# Patient Record
Sex: Female | Born: 1935 | Race: White | Hispanic: No | Smoking: Never smoker
Health system: Southern US, Community
[De-identification: ages and names within clinical notes are randomized; demographics above are authoritative.]

## PROBLEM LIST (undated history)

## (undated) DIAGNOSIS — E7800 Pure hypercholesterolemia, unspecified: Secondary | ICD-10-CM

## (undated) DIAGNOSIS — N289 Disorder of kidney and ureter, unspecified: Secondary | ICD-10-CM

## (undated) DIAGNOSIS — I350 Nonrheumatic aortic (valve) stenosis: Secondary | ICD-10-CM

## (undated) DIAGNOSIS — I10 Essential (primary) hypertension: Secondary | ICD-10-CM

## (undated) DIAGNOSIS — I2510 Atherosclerotic heart disease of native coronary artery without angina pectoris: Secondary | ICD-10-CM

## (undated) DIAGNOSIS — E669 Obesity, unspecified: Secondary | ICD-10-CM

## (undated) HISTORY — DX: Obesity, unspecified: E66.9

## (undated) HISTORY — PX: TOTAL ABDOMINAL HYSTERECTOMY: SHX209

## (undated) HISTORY — PX: CORONARY ARTERY BYPASS GRAFT: SHX141

## (undated) HISTORY — DX: Nonrheumatic aortic (valve) stenosis: I35.0

## (undated) HISTORY — DX: Essential (primary) hypertension: I10

## (undated) HISTORY — DX: Disorder of kidney and ureter, unspecified: N28.9

## (undated) HISTORY — PX: CHOLECYSTECTOMY: SHX55

## (undated) HISTORY — DX: Pure hypercholesterolemia, unspecified: E78.00

## (undated) HISTORY — DX: Atherosclerotic heart disease of native coronary artery without angina pectoris: I25.10

---

## 1997-07-25 ENCOUNTER — Encounter: Admission: RE | Admit: 1997-07-25 | Discharge: 1997-10-23 | Payer: Self-pay | Admitting: *Deleted

## 1997-07-30 ENCOUNTER — Encounter: Admission: RE | Admit: 1997-07-30 | Discharge: 1997-07-30 | Payer: Self-pay | Admitting: Hematology and Oncology

## 1997-08-29 ENCOUNTER — Encounter: Admission: RE | Admit: 1997-08-29 | Discharge: 1997-08-29 | Payer: Self-pay | Admitting: Hematology and Oncology

## 1997-09-27 ENCOUNTER — Encounter: Admission: RE | Admit: 1997-09-27 | Discharge: 1997-09-27 | Payer: Self-pay | Admitting: Internal Medicine

## 1997-11-01 ENCOUNTER — Ambulatory Visit (HOSPITAL_COMMUNITY): Admission: RE | Admit: 1997-11-01 | Discharge: 1997-11-01 | Payer: Self-pay | Admitting: Gastroenterology

## 1997-12-10 ENCOUNTER — Encounter: Admission: RE | Admit: 1997-12-10 | Discharge: 1997-12-10 | Payer: Self-pay | Admitting: Internal Medicine

## 1998-02-12 ENCOUNTER — Ambulatory Visit (HOSPITAL_COMMUNITY): Admission: RE | Admit: 1998-02-12 | Discharge: 1998-02-12 | Payer: Self-pay | Admitting: Internal Medicine

## 1998-02-12 ENCOUNTER — Encounter: Admission: RE | Admit: 1998-02-12 | Discharge: 1998-02-12 | Payer: Self-pay | Admitting: Internal Medicine

## 1998-02-13 ENCOUNTER — Encounter: Payer: Self-pay | Admitting: Radiology

## 1998-02-13 ENCOUNTER — Ambulatory Visit: Admission: RE | Admit: 1998-02-13 | Discharge: 1998-02-13 | Payer: Self-pay | Admitting: Radiology

## 1998-02-27 ENCOUNTER — Encounter: Admission: RE | Admit: 1998-02-27 | Discharge: 1998-05-17 | Payer: Self-pay | Admitting: Internal Medicine

## 1998-03-28 ENCOUNTER — Encounter: Admission: RE | Admit: 1998-03-28 | Discharge: 1998-03-28 | Payer: Self-pay | Admitting: Internal Medicine

## 1998-06-05 ENCOUNTER — Encounter: Admission: RE | Admit: 1998-06-05 | Discharge: 1998-06-05 | Payer: Self-pay | Admitting: Internal Medicine

## 1998-06-06 ENCOUNTER — Emergency Department (HOSPITAL_COMMUNITY): Admission: EM | Admit: 1998-06-06 | Discharge: 1998-06-06 | Payer: Self-pay | Admitting: Emergency Medicine

## 1998-06-27 ENCOUNTER — Encounter: Admission: RE | Admit: 1998-06-27 | Discharge: 1998-06-27 | Payer: Self-pay | Admitting: Internal Medicine

## 1998-07-09 ENCOUNTER — Encounter: Admission: RE | Admit: 1998-07-09 | Discharge: 1998-07-09 | Payer: Self-pay | Admitting: Internal Medicine

## 1998-09-02 ENCOUNTER — Encounter: Admission: RE | Admit: 1998-09-02 | Discharge: 1998-11-25 | Payer: Self-pay | Admitting: Orthopedic Surgery

## 1998-09-06 ENCOUNTER — Encounter: Admission: RE | Admit: 1998-09-06 | Discharge: 1998-09-06 | Payer: Self-pay | Admitting: Internal Medicine

## 1998-12-06 ENCOUNTER — Encounter: Admission: RE | Admit: 1998-12-06 | Discharge: 1998-12-06 | Payer: Self-pay | Admitting: Internal Medicine

## 1999-02-17 ENCOUNTER — Ambulatory Visit (HOSPITAL_COMMUNITY): Admission: RE | Admit: 1999-02-17 | Discharge: 1999-02-17 | Payer: Self-pay

## 1999-03-04 ENCOUNTER — Encounter: Admission: RE | Admit: 1999-03-04 | Discharge: 1999-06-02 | Payer: Self-pay | Admitting: Orthopedic Surgery

## 1999-03-07 ENCOUNTER — Encounter: Admission: RE | Admit: 1999-03-07 | Discharge: 1999-03-07 | Payer: Self-pay | Admitting: Internal Medicine

## 1999-05-02 ENCOUNTER — Encounter: Admission: RE | Admit: 1999-05-02 | Discharge: 1999-05-02 | Payer: Self-pay | Admitting: Internal Medicine

## 1999-06-10 ENCOUNTER — Encounter: Admission: RE | Admit: 1999-06-10 | Discharge: 1999-09-08 | Payer: Self-pay | Admitting: Orthopedic Surgery

## 1999-07-04 ENCOUNTER — Encounter: Admission: RE | Admit: 1999-07-04 | Discharge: 1999-07-04 | Payer: Self-pay | Admitting: Internal Medicine

## 1999-08-06 ENCOUNTER — Encounter (INDEPENDENT_AMBULATORY_CARE_PROVIDER_SITE_OTHER): Payer: Self-pay | Admitting: *Deleted

## 1999-08-06 ENCOUNTER — Ambulatory Visit (HOSPITAL_COMMUNITY): Admission: RE | Admit: 1999-08-06 | Discharge: 1999-08-06 | Payer: Self-pay | Admitting: Gastroenterology

## 1999-08-29 ENCOUNTER — Encounter: Admission: RE | Admit: 1999-08-29 | Discharge: 1999-08-29 | Payer: Self-pay | Admitting: Internal Medicine

## 1999-09-15 ENCOUNTER — Encounter: Admission: RE | Admit: 1999-09-15 | Discharge: 1999-12-14 | Payer: Self-pay | Admitting: Internal Medicine

## 1999-11-14 ENCOUNTER — Encounter: Admission: RE | Admit: 1999-11-14 | Discharge: 1999-11-14 | Payer: Self-pay | Admitting: Internal Medicine

## 1999-11-14 ENCOUNTER — Inpatient Hospital Stay (HOSPITAL_COMMUNITY): Admission: EM | Admit: 1999-11-14 | Discharge: 1999-11-16 | Payer: Self-pay | Admitting: Emergency Medicine

## 1999-11-14 ENCOUNTER — Encounter: Payer: Self-pay | Admitting: Internal Medicine

## 1999-11-16 ENCOUNTER — Encounter: Payer: Self-pay | Admitting: Internal Medicine

## 1999-11-25 ENCOUNTER — Encounter: Admission: RE | Admit: 1999-11-25 | Discharge: 1999-11-25 | Payer: Self-pay | Admitting: Internal Medicine

## 1999-12-08 ENCOUNTER — Ambulatory Visit: Admission: RE | Admit: 1999-12-08 | Discharge: 1999-12-08 | Payer: Self-pay

## 2000-01-01 ENCOUNTER — Encounter: Admission: RE | Admit: 2000-01-01 | Discharge: 2000-01-01 | Payer: Self-pay | Admitting: Hematology and Oncology

## 2000-01-01 ENCOUNTER — Ambulatory Visit (HOSPITAL_COMMUNITY): Admission: RE | Admit: 2000-01-01 | Discharge: 2000-01-01 | Payer: Self-pay | Admitting: Hematology and Oncology

## 2000-03-03 ENCOUNTER — Encounter: Admission: RE | Admit: 2000-03-03 | Discharge: 2000-03-03 | Payer: Self-pay | Admitting: Internal Medicine

## 2000-06-23 ENCOUNTER — Encounter: Admission: RE | Admit: 2000-06-23 | Discharge: 2000-06-23 | Payer: Self-pay | Admitting: Internal Medicine

## 2000-07-01 ENCOUNTER — Ambulatory Visit (HOSPITAL_COMMUNITY): Admission: RE | Admit: 2000-07-01 | Discharge: 2000-07-01 | Payer: Self-pay

## 2000-09-07 ENCOUNTER — Encounter: Admission: RE | Admit: 2000-09-07 | Discharge: 2000-09-07 | Payer: Self-pay | Admitting: *Deleted

## 2001-01-21 ENCOUNTER — Encounter: Admission: RE | Admit: 2001-01-21 | Discharge: 2001-01-21 | Payer: Self-pay | Admitting: Internal Medicine

## 2001-04-06 ENCOUNTER — Encounter: Admission: RE | Admit: 2001-04-06 | Discharge: 2001-04-06 | Payer: Self-pay | Admitting: Internal Medicine

## 2001-06-29 ENCOUNTER — Encounter: Admission: RE | Admit: 2001-06-29 | Discharge: 2001-06-29 | Payer: Self-pay

## 2001-07-08 ENCOUNTER — Ambulatory Visit (HOSPITAL_COMMUNITY): Admission: RE | Admit: 2001-07-08 | Discharge: 2001-07-08 | Payer: Self-pay | Admitting: Internal Medicine

## 2001-08-09 ENCOUNTER — Ambulatory Visit (HOSPITAL_COMMUNITY): Admission: RE | Admit: 2001-08-09 | Discharge: 2001-08-09 | Payer: Self-pay | Admitting: Internal Medicine

## 2001-09-28 ENCOUNTER — Encounter: Admission: RE | Admit: 2001-09-28 | Discharge: 2001-09-28 | Payer: Self-pay | Admitting: Internal Medicine

## 2001-09-28 ENCOUNTER — Ambulatory Visit (HOSPITAL_COMMUNITY): Admission: RE | Admit: 2001-09-28 | Discharge: 2001-09-28 | Payer: Self-pay | Admitting: *Deleted

## 2001-09-28 ENCOUNTER — Encounter: Payer: Self-pay | Admitting: *Deleted

## 2001-09-30 ENCOUNTER — Encounter: Admission: RE | Admit: 2001-09-30 | Discharge: 2001-09-30 | Payer: Self-pay | Admitting: Internal Medicine

## 2001-11-07 ENCOUNTER — Encounter: Admission: RE | Admit: 2001-11-07 | Discharge: 2001-11-07 | Payer: Self-pay | Admitting: Internal Medicine

## 2002-03-17 ENCOUNTER — Encounter: Admission: RE | Admit: 2002-03-17 | Discharge: 2002-03-17 | Payer: Self-pay | Admitting: Internal Medicine

## 2002-07-04 ENCOUNTER — Encounter: Admission: RE | Admit: 2002-07-04 | Discharge: 2002-07-04 | Payer: Self-pay | Admitting: Internal Medicine

## 2002-07-04 ENCOUNTER — Ambulatory Visit (HOSPITAL_COMMUNITY): Admission: RE | Admit: 2002-07-04 | Discharge: 2002-07-04 | Payer: Self-pay | Admitting: Internal Medicine

## 2002-07-11 ENCOUNTER — Ambulatory Visit (HOSPITAL_COMMUNITY): Admission: RE | Admit: 2002-07-11 | Discharge: 2002-07-11 | Payer: Self-pay | Admitting: *Deleted

## 2002-07-18 ENCOUNTER — Ambulatory Visit (HOSPITAL_COMMUNITY): Admission: RE | Admit: 2002-07-18 | Discharge: 2002-07-18 | Payer: Self-pay | Admitting: Internal Medicine

## 2002-08-16 ENCOUNTER — Encounter: Admission: RE | Admit: 2002-08-16 | Discharge: 2002-08-16 | Payer: Self-pay | Admitting: Internal Medicine

## 2002-10-16 ENCOUNTER — Encounter: Admission: RE | Admit: 2002-10-16 | Discharge: 2002-10-16 | Payer: Self-pay | Admitting: Internal Medicine

## 2002-11-14 ENCOUNTER — Encounter: Admission: RE | Admit: 2002-11-14 | Discharge: 2002-11-14 | Payer: Self-pay | Admitting: Internal Medicine

## 2002-12-05 ENCOUNTER — Encounter: Admission: RE | Admit: 2002-12-05 | Discharge: 2002-12-05 | Payer: Self-pay | Admitting: Internal Medicine

## 2003-01-24 ENCOUNTER — Encounter: Admission: RE | Admit: 2003-01-24 | Discharge: 2003-01-24 | Payer: Self-pay | Admitting: Internal Medicine

## 2003-03-08 ENCOUNTER — Encounter: Admission: RE | Admit: 2003-03-08 | Discharge: 2003-03-08 | Payer: Self-pay | Admitting: Internal Medicine

## 2003-04-10 ENCOUNTER — Ambulatory Visit (HOSPITAL_COMMUNITY): Admission: RE | Admit: 2003-04-10 | Discharge: 2003-04-10 | Payer: Self-pay | Admitting: Internal Medicine

## 2003-04-10 ENCOUNTER — Encounter: Admission: RE | Admit: 2003-04-10 | Discharge: 2003-04-10 | Payer: Self-pay | Admitting: Internal Medicine

## 2003-10-03 ENCOUNTER — Encounter: Admission: RE | Admit: 2003-10-03 | Discharge: 2003-10-03 | Payer: Self-pay | Admitting: Internal Medicine

## 2003-10-03 ENCOUNTER — Ambulatory Visit (HOSPITAL_COMMUNITY): Admission: RE | Admit: 2003-10-03 | Discharge: 2003-10-03 | Payer: Self-pay | Admitting: Internal Medicine

## 2003-10-24 ENCOUNTER — Encounter: Admission: RE | Admit: 2003-10-24 | Discharge: 2003-10-24 | Payer: Self-pay | Admitting: Internal Medicine

## 2003-10-24 ENCOUNTER — Ambulatory Visit (HOSPITAL_COMMUNITY): Admission: RE | Admit: 2003-10-24 | Discharge: 2003-10-24 | Payer: Self-pay | Admitting: Internal Medicine

## 2003-12-19 ENCOUNTER — Encounter: Admission: RE | Admit: 2003-12-19 | Discharge: 2003-12-19 | Payer: Self-pay | Admitting: Internal Medicine

## 2004-01-03 ENCOUNTER — Emergency Department (HOSPITAL_COMMUNITY): Admission: EM | Admit: 2004-01-03 | Discharge: 2004-01-03 | Payer: Self-pay | Admitting: *Deleted

## 2004-02-08 ENCOUNTER — Ambulatory Visit: Payer: Self-pay | Admitting: Internal Medicine

## 2004-02-08 ENCOUNTER — Ambulatory Visit (HOSPITAL_COMMUNITY): Admission: RE | Admit: 2004-02-08 | Discharge: 2004-02-08 | Payer: Self-pay | Admitting: Internal Medicine

## 2004-03-05 ENCOUNTER — Encounter: Admission: RE | Admit: 2004-03-05 | Discharge: 2004-03-05 | Payer: Self-pay | Admitting: Sports Medicine

## 2004-04-16 ENCOUNTER — Ambulatory Visit: Payer: Self-pay

## 2004-04-16 ENCOUNTER — Ambulatory Visit: Payer: Self-pay | Admitting: *Deleted

## 2004-04-22 ENCOUNTER — Ambulatory Visit: Payer: Self-pay

## 2004-04-27 HISTORY — PX: OTHER SURGICAL HISTORY: SHX169

## 2004-05-05 ENCOUNTER — Ambulatory Visit (HOSPITAL_COMMUNITY): Admission: RE | Admit: 2004-05-05 | Discharge: 2004-05-05 | Payer: Self-pay | Admitting: Orthopaedic Surgery

## 2004-07-04 ENCOUNTER — Ambulatory Visit: Payer: Self-pay | Admitting: Internal Medicine

## 2004-07-18 ENCOUNTER — Ambulatory Visit: Payer: Self-pay | Admitting: Internal Medicine

## 2004-11-25 ENCOUNTER — Inpatient Hospital Stay (HOSPITAL_COMMUNITY): Admission: EM | Admit: 2004-11-25 | Discharge: 2004-11-27 | Payer: Self-pay | Admitting: Emergency Medicine

## 2004-11-26 ENCOUNTER — Ambulatory Visit: Payer: Self-pay | Admitting: Cardiology

## 2004-12-09 ENCOUNTER — Ambulatory Visit: Payer: Self-pay | Admitting: Cardiology

## 2005-01-26 ENCOUNTER — Ambulatory Visit: Payer: Self-pay | Admitting: Family Medicine

## 2005-02-03 ENCOUNTER — Ambulatory Visit: Payer: Self-pay | Admitting: Family Medicine

## 2005-12-04 IMAGING — CR DG SHOULDER 2+V*R*
3 series · 3 of 3 positions shown · non-contrast
Comparison: none

CLINICAL DATA: Fall 6 days ago.  Shoulder pain. 
RIGHT SHOULDER, 10/03/03

[view not recorded (1 of 3)]
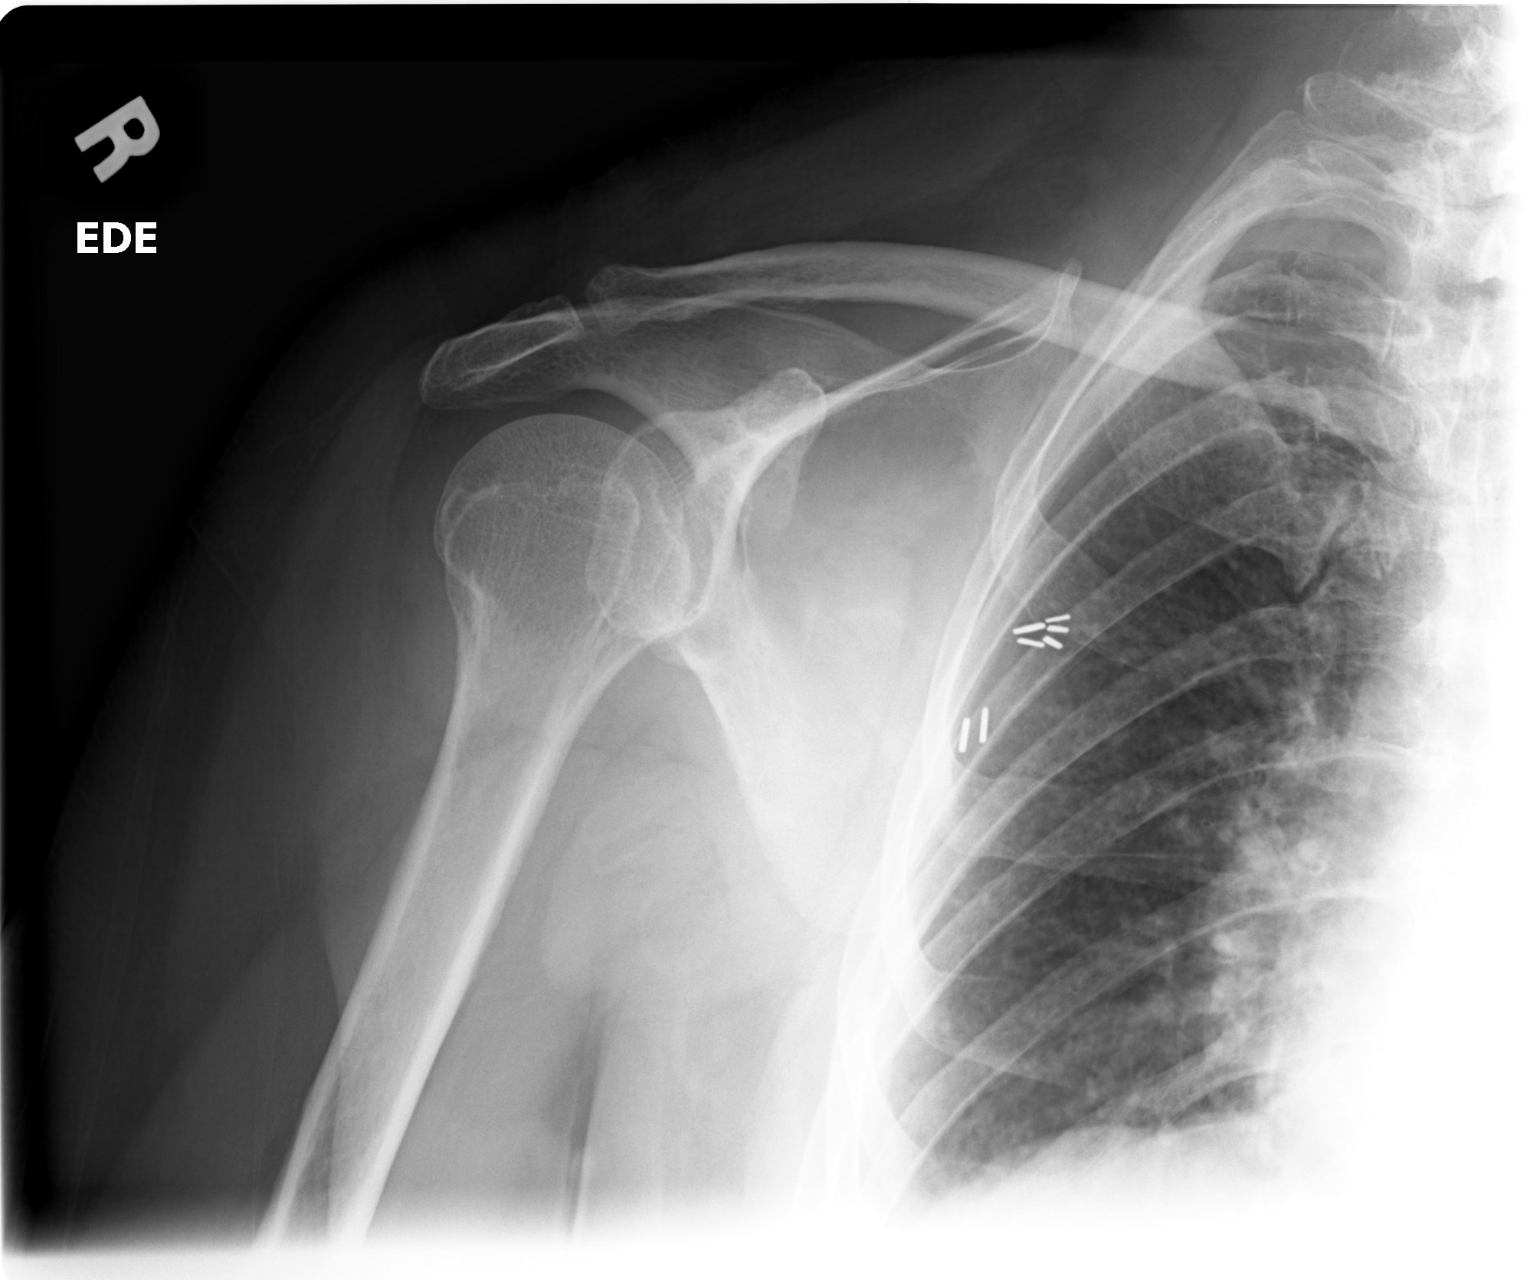

[view not recorded (2 of 3)]
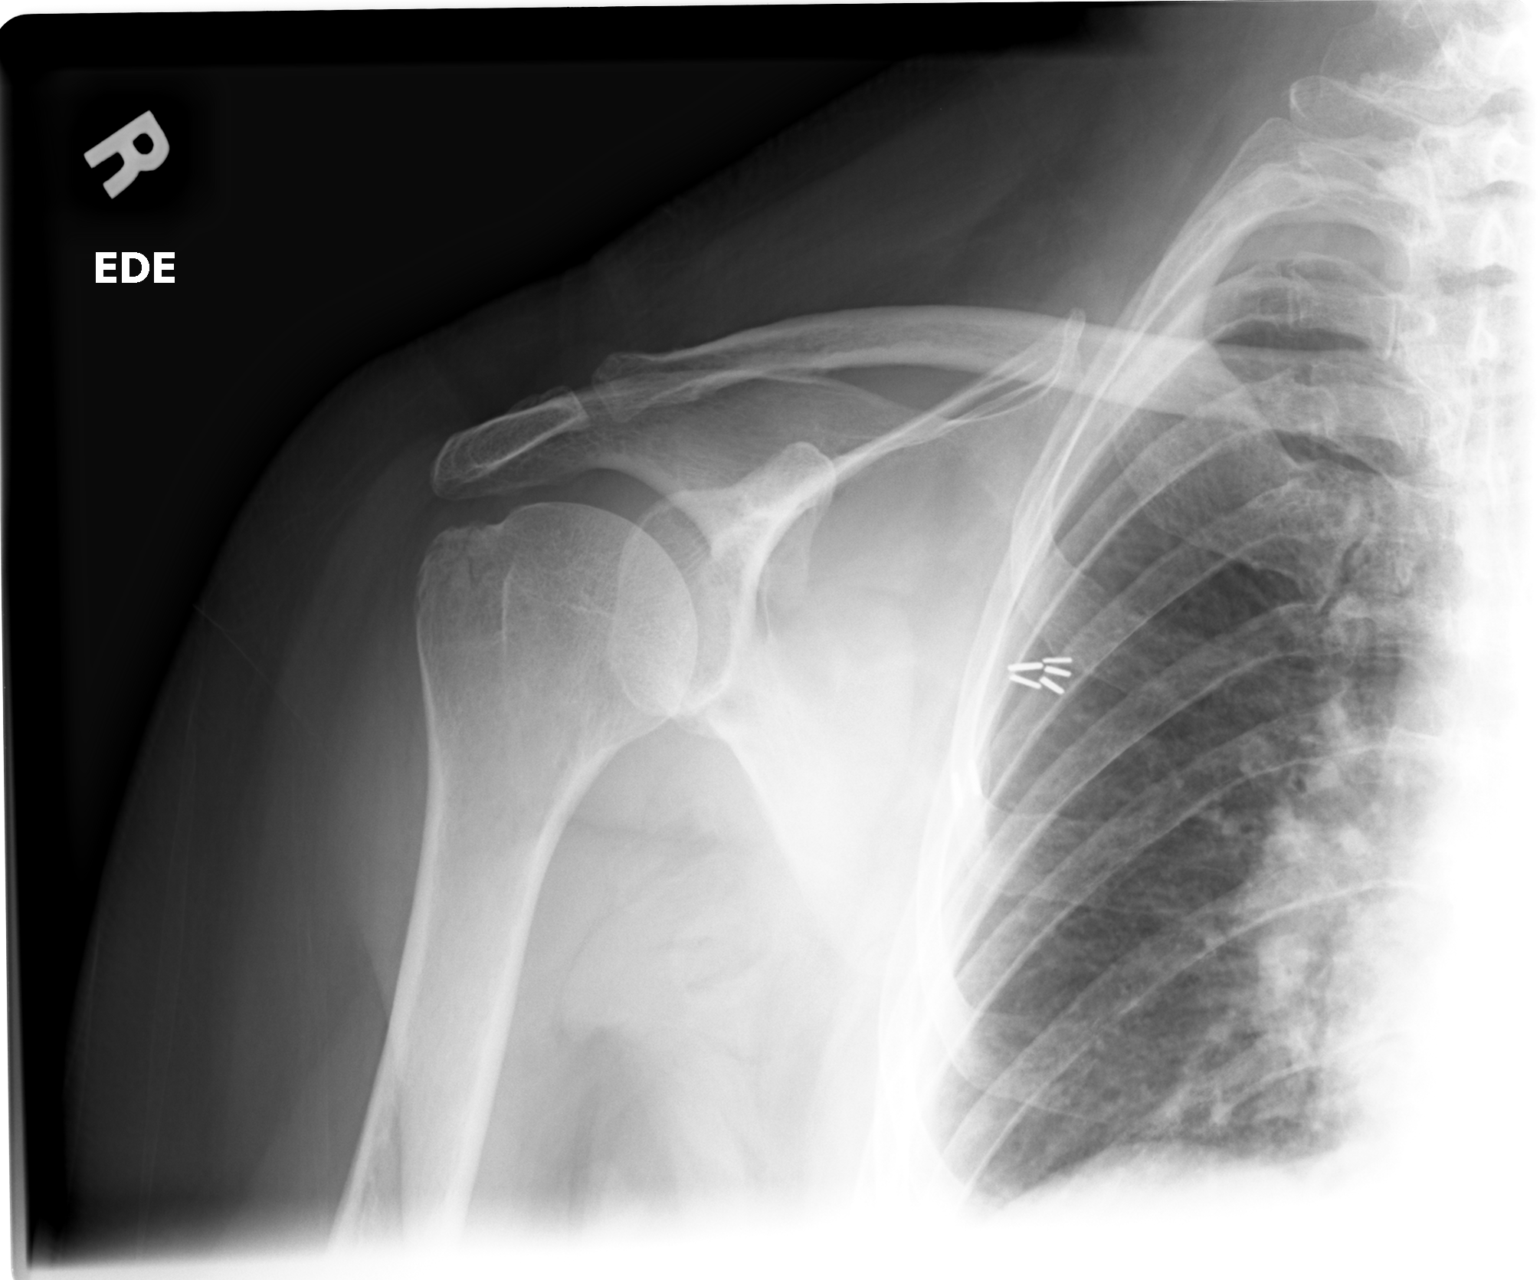

[view not recorded (3 of 3)]
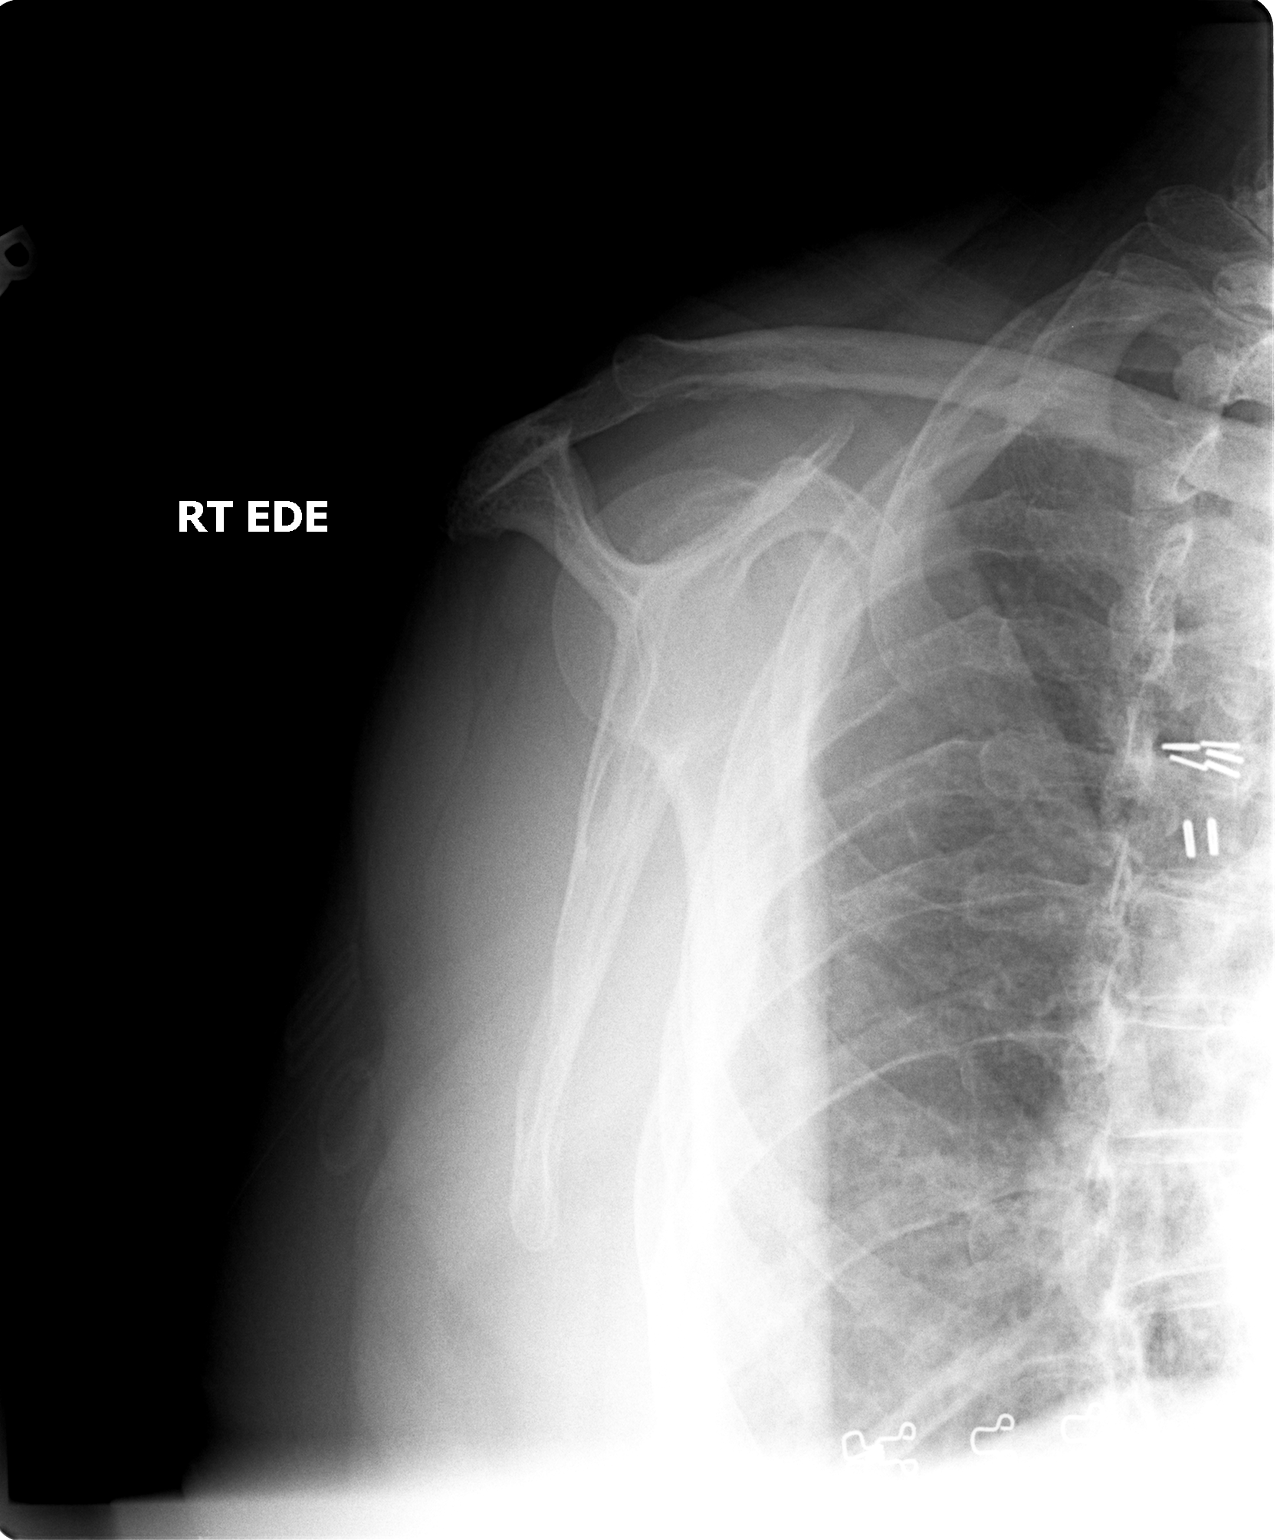

[3 of 3 positions shown; findings below may reference images not displayed]

FINDINGS: There is a nondisplaced fracture of the greater tuberosity of the humerus.  No evidence of subluxation or dislocation.  No other significant abnormalities identified.  
IMPRESSION
Nondisplaced fracture of the humerus - greater tuberosity.

## 2005-12-09 ENCOUNTER — Ambulatory Visit: Payer: Self-pay | Admitting: Cardiology

## 2005-12-14 ENCOUNTER — Ambulatory Visit: Payer: Self-pay

## 2006-03-24 ENCOUNTER — Ambulatory Visit: Payer: Self-pay | Admitting: Cardiology

## 2007-01-19 ENCOUNTER — Ambulatory Visit: Payer: Self-pay | Admitting: Cardiology

## 2007-02-08 ENCOUNTER — Ambulatory Visit: Payer: Self-pay

## 2007-09-21 ENCOUNTER — Ambulatory Visit: Payer: Self-pay | Admitting: Cardiology

## 2008-03-19 ENCOUNTER — Encounter: Admission: RE | Admit: 2008-03-19 | Discharge: 2008-03-19 | Payer: Self-pay | Admitting: Family Medicine

## 2008-06-13 ENCOUNTER — Ambulatory Visit: Payer: Self-pay | Admitting: Cardiology

## 2008-06-28 ENCOUNTER — Encounter: Payer: Self-pay | Admitting: Cardiology

## 2008-06-28 ENCOUNTER — Ambulatory Visit: Payer: Self-pay

## 2008-11-26 ENCOUNTER — Encounter: Payer: Self-pay | Admitting: Cardiology

## 2009-02-06 DIAGNOSIS — I25810 Atherosclerosis of coronary artery bypass graft(s) without angina pectoris: Secondary | ICD-10-CM

## 2009-02-06 DIAGNOSIS — I10 Essential (primary) hypertension: Secondary | ICD-10-CM | POA: Insufficient documentation

## 2009-02-06 DIAGNOSIS — I359 Nonrheumatic aortic valve disorder, unspecified: Secondary | ICD-10-CM | POA: Insufficient documentation

## 2009-02-06 DIAGNOSIS — R0602 Shortness of breath: Secondary | ICD-10-CM

## 2009-02-07 ENCOUNTER — Encounter (INDEPENDENT_AMBULATORY_CARE_PROVIDER_SITE_OTHER): Payer: Self-pay | Admitting: *Deleted

## 2009-02-07 ENCOUNTER — Ambulatory Visit: Payer: Self-pay | Admitting: Cardiology

## 2009-02-08 ENCOUNTER — Inpatient Hospital Stay (HOSPITAL_COMMUNITY): Admission: AD | Admit: 2009-02-08 | Discharge: 2009-02-13 | Payer: Self-pay | Admitting: Cardiology

## 2009-02-08 ENCOUNTER — Ambulatory Visit: Payer: Self-pay | Admitting: Cardiology

## 2009-02-08 ENCOUNTER — Inpatient Hospital Stay (HOSPITAL_COMMUNITY): Admission: RE | Admit: 2009-02-08 | Discharge: 2009-02-08 | Payer: Self-pay | Admitting: Cardiology

## 2009-02-08 LAB — CONVERTED CEMR LAB
Basophils Relative: 0.3 % (ref 0.0–3.0)
CO2: 34 meq/L — ABNORMAL HIGH (ref 19–32)
Creatinine, Ser: 1.1 mg/dL (ref 0.4–1.2)
Eosinophils Absolute: 0.4 10*3/uL (ref 0.0–0.7)
Glucose, Bld: 96 mg/dL (ref 70–99)
HCT: 40.7 % (ref 36.0–46.0)
Hemoglobin: 14 g/dL (ref 12.0–15.0)
Lymphs Abs: 3.3 10*3/uL (ref 0.7–4.0)
Monocytes Absolute: 0.8 10*3/uL (ref 0.1–1.0)
Monocytes Relative: 5.6 % (ref 3.0–12.0)
Sodium: 139 meq/L (ref 135–145)
WBC: 14.4 10*3/uL — ABNORMAL HIGH (ref 4.5–10.5)
aPTT: 27.2 s (ref 21.7–28.8)

## 2009-02-11 ENCOUNTER — Encounter: Payer: Self-pay | Admitting: Cardiology

## 2009-02-14 ENCOUNTER — Telehealth: Payer: Self-pay | Admitting: Cardiology

## 2009-02-22 ENCOUNTER — Telehealth (INDEPENDENT_AMBULATORY_CARE_PROVIDER_SITE_OTHER): Payer: Self-pay | Admitting: *Deleted

## 2009-03-04 ENCOUNTER — Ambulatory Visit: Payer: Self-pay | Admitting: Cardiology

## 2009-03-04 ENCOUNTER — Ambulatory Visit: Payer: Self-pay

## 2009-03-04 ENCOUNTER — Ambulatory Visit (HOSPITAL_COMMUNITY): Admission: RE | Admit: 2009-03-04 | Discharge: 2009-03-04 | Payer: Self-pay | Admitting: Cardiology

## 2009-03-04 ENCOUNTER — Encounter: Payer: Self-pay | Admitting: Cardiology

## 2009-03-04 DIAGNOSIS — R079 Chest pain, unspecified: Secondary | ICD-10-CM | POA: Insufficient documentation

## 2009-03-04 DIAGNOSIS — E119 Type 2 diabetes mellitus without complications: Secondary | ICD-10-CM | POA: Insufficient documentation

## 2009-03-04 DIAGNOSIS — E7800 Pure hypercholesterolemia, unspecified: Secondary | ICD-10-CM

## 2009-03-05 ENCOUNTER — Encounter: Payer: Self-pay | Admitting: Cardiology

## 2009-03-08 ENCOUNTER — Telehealth: Payer: Self-pay | Admitting: Cardiology

## 2009-03-12 ENCOUNTER — Encounter: Payer: Self-pay | Admitting: Cardiology

## 2009-03-13 LAB — CONVERTED CEMR LAB
Calcium: 9.3 mg/dL (ref 8.4–10.5)
Chloride: 96 meq/L (ref 96–112)
Pro B Natriuretic peptide (BNP): 45.5 pg/mL (ref 0.0–100.0)
Sodium: 139 meq/L (ref 135–145)

## 2009-03-14 ENCOUNTER — Encounter (INDEPENDENT_AMBULATORY_CARE_PROVIDER_SITE_OTHER): Payer: Self-pay | Admitting: *Deleted

## 2009-04-10 ENCOUNTER — Encounter: Payer: Self-pay | Admitting: Cardiology

## 2009-04-10 ENCOUNTER — Ambulatory Visit: Payer: Self-pay | Admitting: Cardiology

## 2009-04-23 ENCOUNTER — Encounter: Payer: Self-pay | Admitting: Cardiology

## 2009-04-24 LAB — CONVERTED CEMR LAB
BUN: 27 mg/dL — ABNORMAL HIGH (ref 6–23)
Calcium: 9.3 mg/dL (ref 8.4–10.5)
Chloride: 103 meq/L (ref 96–112)
Creatinine, Ser: 1.2 mg/dL (ref 0.40–1.20)

## 2009-05-29 ENCOUNTER — Encounter: Payer: Self-pay | Admitting: Cardiology

## 2009-06-27 ENCOUNTER — Inpatient Hospital Stay (HOSPITAL_COMMUNITY): Admission: EM | Admit: 2009-06-27 | Discharge: 2009-07-04 | Payer: Self-pay | Admitting: Emergency Medicine

## 2009-09-13 ENCOUNTER — Inpatient Hospital Stay (HOSPITAL_COMMUNITY): Admission: EM | Admit: 2009-09-13 | Discharge: 2009-09-16 | Payer: Self-pay | Admitting: Emergency Medicine

## 2009-09-18 ENCOUNTER — Encounter: Payer: Self-pay | Admitting: Cardiology

## 2009-11-18 ENCOUNTER — Encounter: Payer: Self-pay | Admitting: Cardiology

## 2009-11-26 ENCOUNTER — Encounter: Payer: Self-pay | Admitting: Cardiology

## 2009-11-27 ENCOUNTER — Encounter: Payer: Self-pay | Admitting: Cardiology

## 2009-11-27 ENCOUNTER — Ambulatory Visit: Payer: Self-pay | Admitting: Cardiology

## 2009-12-12 ENCOUNTER — Ambulatory Visit: Payer: Self-pay | Admitting: Internal Medicine

## 2009-12-12 ENCOUNTER — Encounter: Payer: Self-pay | Admitting: Cardiology

## 2009-12-12 ENCOUNTER — Ambulatory Visit (HOSPITAL_COMMUNITY): Admission: RE | Admit: 2009-12-12 | Discharge: 2009-12-12 | Payer: Self-pay | Admitting: Cardiology

## 2009-12-12 ENCOUNTER — Ambulatory Visit: Payer: Self-pay

## 2010-01-14 ENCOUNTER — Inpatient Hospital Stay (HOSPITAL_COMMUNITY): Admission: EM | Admit: 2010-01-14 | Discharge: 2010-01-20 | Payer: Self-pay | Admitting: Emergency Medicine

## 2010-05-29 NOTE — Letter (Signed)
Summary: [facility]   [facility] Progress Notes   Imported By: Roderic Ovens 12/04/2009 11:41:14  _____________________________________________________________________  External Attachment:    Type:   Image     Comment:   External Document

## 2010-05-29 NOTE — Consult Note (Signed)
Summary: [facility]  [facility]   Imported By: Marylou Mccoy 06/20/2009 16:08:44  _____________________________________________________________________  External Attachment:    Type:   Image     Comment:   External Document

## 2010-05-29 NOTE — Letter (Signed)
Summary: [facility] Office Visit Note   [facility] Office Visit Note   Imported By: Roderic Ovens 12/04/2009 11:42:01  _____________________________________________________________________  External Attachment:    Type:   Image     Comment:   External Document

## 2010-05-29 NOTE — Letter (Signed)
Summary: [facility]   [facility] Progress Note   Imported By: Roderic Ovens 12/05/2009 12:44:42  _____________________________________________________________________  External Attachment:    Type:   Image     Comment:   External Document

## 2010-05-29 NOTE — Letter (Signed)
Summary: [facility]  [facility]   Imported By: Marylou Mccoy 10/31/2009 16:09:25  _____________________________________________________________________  External Attachment:    Type:   Image     Comment:   External Document

## 2010-05-29 NOTE — Assessment & Plan Note (Signed)
Summary: [name]   Visit Type:  Follow-up Primary Provider:  Valetta Close MD  CC:  Sob.  History of Present Illness: [name] is pleasant female who has a history of coronary artery disease status post bypassing graft in 1996.  She also has chronic chest pain.  She underwent cardiac catheterization in October 2010 which revealed patent grafts and moderately severe aortic stenosis. There was an 80% stenosis in the distal segment of the vein graft to the diagonal. Her aortic valve area was 1 cm. Note her course was complicated by contrast nephropathy which did improve prior to discharge. She was treated medically. She had an echocardiogram performed in November of 2010 and it showed normal LV function, moderate aortic stenosis with a mean gradient of 32 mmHg,  moderate left atrial enlargement and mild right atrial enlargement. I last saw her in Dec 2010. Note a BNP was normal. Since I saw her previously she apparently has had multiple falls. One episode occurred while she was washing her hair. She was bent over the sink. She raised herself and lost her balance by her report and fell. A second episode occurred after arising from bed. She denies syncope, palpitations with either event. She continues to have dyspnea on exertion but no orthopnea, PND or pedal edema. She occasionally feels chest pain which is a chronic issue. It is described as a sharp sensation and last seconds.  Current Medications (verified): 1)  Isosorbide Mononitrate Cr 30 Mg Xr24h-Tab (Isosorbide Mononitrate) .... 3 Tabs Daily 2)  Lexapro 20 Mg Tabs (Escitalopram Oxalate) .Marland Kitchen.. 1 Atb Once Daily 3)  Clonazepam 1 Mg Tabs (Clonazepam) .Marland Kitchen.. 1 Tab At Bedtime 4)  Allopurinol 300 Mg Tabs (Allopurinol) .... One Tab By Mouth Once Daily 5)  Lantus 100 Unit/ml Soln (Insulin Glargine) .... 35 Units Two Times A Day 6)  Lomotil 2.5-0.025 Mg Tabs (Diphenoxylate-Atropine) .... As Needed 7)  Aspirin 81 Mg Tbec (Aspirin) .... Take  One Tablet By Mouth Daily 8)  Nitrostat 0.4 Mg Subl (Nitroglycerin) .Marland Kitchen.. 1 Tablet Under Tongue At Onset of Chest Pain; You May Repeat Every 5 Minutes For Up To 3 Doses. 9)  Lyrica 150 Mg Caps (Pregabalin) .... One Tab By Mouth Two Times A Day With A 50mg  At Lunch 10)  Simvastatin 80 Mg Tabs (Simvastatin) .... Take One Tablet By Mouth Daily At Bedtime 11)  Furosemide 40 Mg Tabs (Furosemide) .... One Tab By Mouth in The Am and One Half Tablet in The Pm 12)  Pepcid 20 Mg Tabs (Famotidine) .... One Tab By Mouth Two Times A Day 13)  Novolog Flexpen 100 Unit/ml Soln (Insulin Aspart) .Marland Kitchen.. 18 Units Morning 50 Units At Dinner and 34 Units At Lunch 14)  Multivitamins   Tabs (Multiple Vitamin) .Marland Kitchen.. 1 Tab By Mouth Once Daily 15)  Vitamin B-6 100 Mg Tabs (Pyridoxine Hcl) .Marland Kitchen.. 1 Tab By Mouth Once Daily 16)  Potassium Chloride Crys Cr 20 Meq Cr-Tabs (Potassium Chloride Crys Cr) .... Take One Tablet By Mouth Daily 17)  Cozaar 50 Mg Tabs (Losartan Potassium) .... Take 1 1/2 Tablets  Daily 18)  Synthroid 75 Mcg Tabs (Levothyroxine Sodium) .... Take 1/2 Tablet Daily 19)  Vitamin B-12 100 Mcg Tabs (Cyanocobalamin) .... Take 1 Tablet By Mouth Once A Day 20)  Iron Infusions .... Once A Week  Allergies: 1)  ! * Ivp Dye 2)  ! Penicillin 3)  ! Sulfa  Past History:  Past Medical History: Reviewed history from 03/04/2009 and no changes required. _  PAST MEDICAL HISTORY:  1.  Coronary artery disease.      1.  Five-vessel CABG in 1996:  LIMA - LAD; SVG - Diagnosis; SVG - OM-          2/OM-3;  SVG - RCA.  2.  Diabetes mellitus, insulin-requiring.      1.  Initial diagnosis approximately 16 years ago.      2.  Associated peripheral neuropathy.  3.  Hypertension.  4.  History of hypercholesterolemia.      1.  Intolerant to Pravachol/Lipitor secondary to myalgia.  5.  Obesity. aortic stenosis contrast nephropathy  Past Surgical History: Prior CABG Status post cholecystectomy. Status post total  hysterectomy. Status post right shoulder surgery, January 2006.  Social History: Reviewed history from 02/02/2006 and no changes required. Retired.  Widowed and lives alone.  Never smoked.  No EtOH, no drugs, 3 caffeinated drinks per day.  No regular exercise.  Review of Systems       Complains of generalized weakness and leg weakness in particular; no fevers or chills, productive cough, hemoptysis, dysphasia, odynophagia, melena, hematochezia, dysuria, hematuria, rash, seizure activity, orthopnea, PND, pedal edema, claudication. Remaining systems are negative.   Vital Signs:  Patient profile:   77 year old female Height:      67 inches Weight:      203.75 pounds BMI:     32.03 Pulse rate:   82 / minute Pulse (ortho):   84 / minute Pulse rhythm:   regular Resp:     20 per minute BP sitting:   119 / 67  (left arm) BP standing:   119 / 69 Cuff size:   large  Vitals Entered By: Vikki Ports (November 27, 2009 4:00 PM)  Serial Vital Signs/Assessments:  Time      Position  BP       Pulse  Resp  Temp     By 4:41 PM   Lying LA  116/70   80                    Luz Jaramillo 4:42 PM   Sitting   98/63    88                    Luz Jaramillo 4:44 PM   Standing  119/69   84                    Luz Jaramillo 4:46 PM   Standing  137/78   85                    Luz Jaramillo 4:50 PM   Standing  130/74   88                    Luz Jaramillo  Comments: 4:50 PM Lying - No symptoms Sitting - Lightheaded Standing - Dizzy Standing 2 minutes- Dizzy Standing 3 minutes- Dizzy By: Vikki Ports    Physical Exam  General:  Well-developed well-nourished in no acute distress.  Skin is warm and dry.  HEENT is normal.  Neck is supple. No thyromegaly.  Chest is clear to auscultation with normal expansion.  Cardiovascular exam is regular rate and rhythm.  Abdominal exam nontender or distended. No masses palpated. Extremities show no edema. neuro grossly intact    EKG  Procedure date:   11/27/2009  Findings:      Sinus rhythm at a rate of 82. Axis  normal. Nonspecific T-wave changes.  Impression & Recommendations:  Problem # 1:  PURE HYPERCHOLESTEROLEMIA (ICD-272.0) Discontinued Zocor given recent FDA warnings. She is complaining of leg weakness. I wonder if the statin is contributing. Check CK. Add Pravachol 80 mg p.o. daily if CK normal. Check lipids and liver in 6 weeks. Her updated medication list for this problem includes:    Pravastatin Sodium 80 Mg Tabs (Pravastatin sodium) .Marland Kitchen... Take one tablet by mouth daily at bedtime  Her updated medication list for this problem includes:    Simvastatin 80 Mg Tabs (Simvastatin) .Marland Kitchen... Take one tablet by mouth daily at bedtime  Orders: T-CK Total [phone]) T-Hepatic Function [phone]) T-Lipid Profile ([id number])  Problem # 2:  DM (ICD-250.00)  Her updated medication list for this problem includes:    Lantus 100 Unit/ml Soln (Insulin glargine) .Marland KitchenMarland KitchenMarland KitchenMarland Kitchen 35 units two times a day    Aspirin 81 Mg Tbec (Aspirin) .Marland Kitchen... Take one tablet by mouth daily    Novolog Flexpen 100 Unit/ml Soln (Insulin aspart) .Marland KitchenMarland KitchenMarland KitchenMarland Kitchen 18 units morning 50 units at dinner and 34 units at lunch  Her updated medication list for this problem includes:    Lantus 100 Unit/ml Soln (Insulin glargine) .Marland KitchenMarland KitchenMarland KitchenMarland Kitchen 35 units two times a day    Aspirin 81 Mg Tbec (Aspirin) .Marland Kitchen... Take one tablet by mouth daily    Novolog Flexpen 100 Unit/ml Soln (Insulin aspart) .Marland KitchenMarland KitchenMarland KitchenMarland Kitchen 18 units morning 50 units at dinner and 34 units at lunch    Cozaar 50 Mg Tabs (Losartan potassium) .Marland Kitchen... Take 1 1/2 tablets  daily  Her updated medication list for this problem includes:    Lantus 100 Unit/ml Soln (Insulin glargine) .Marland KitchenMarland KitchenMarland KitchenMarland Kitchen 35 units two times a day    Aspirin 81 Mg Tbec (Aspirin) .Marland Kitchen... Take one tablet by mouth daily    Novolog Flexpen 100 Unit/ml Soln (Insulin aspart) .Marland KitchenMarland KitchenMarland KitchenMarland Kitchen 18 units morning 50 units at dinner and 34 units at lunch    Cozaar 50 Mg Tabs (Losartan potassium) .Marland Kitchen... Take 1 1/2  tablets  daily  Problem # 3:  OTHER FALL (ICD-E888.8) I am not clear on why the patient is falling. Certainly she has significant aortic stenosis but she denies syncope. There may be an orthostatic component. I will discontinue her Cozaar to allow more blood pressure with standing to see if this improves her symptoms. I will repeat her echocardiogram to reassess aortic stenosis. I had long discussions with the patient and her daughter today. Even if the patient is having syncope from aortic stenosis our options are most likely limited. Given her overall frail state, multiple medical problems and the fact that she would be a redo, I do not think she would be a good candidate for aortic valve replacement. Percutaneous aortic valve replacement would also be very high risk as she has had contrast nephropathy even with cardiac catheterization in the past. They are also not sure if they would consider proceeding with these. I explained that aortic stenosis will not improve but will slowly worsen in the future and most likely take her life. They understand this. We will await followup echocardiogram. If severe we may have a surgeon evaluate her to see if she is a candidate for aortic valve replacement.  Problem # 4:  CHEST PAIN (ICD-786.50) Symptoms atypical and chronic. No further evaluation at this time. The following medications were removed from the medication list:    Atenolol 25 Mg Tabs (Atenolol) ..... One tablet by mouth twice daily Her updated medication list for this problem includes:  Isosorbide Mononitrate Cr 30 Mg Xr24h-tab (Isosorbide mononitrate) .Marland KitchenMarland KitchenMarland KitchenMarland Kitchen 3 tabs daily    Aspirin 81 Mg Tbec (Aspirin) .Marland Kitchen... Take one tablet by mouth daily    Nitrostat 0.4 Mg Subl (Nitroglycerin) .Marland Kitchen... 1 tablet under tongue at onset of chest pain; you may repeat every 5 minutes for up to 3 doses.  Problem # 5:  HYPERTENSION, BENIGN (ICD-401.1) Discontinued Cozaar as described above. Follow blood pressure. The  following medications were removed from the medication list:    Atenolol 25 Mg Tabs (Atenolol) ..... One tablet by mouth twice daily Her updated medication list for this problem includes:    Aspirin 81 Mg Tbec (Aspirin) .Marland Kitchen... Take one tablet by mouth daily    Furosemide 40 Mg Tabs (Furosemide) ..... One tab by mouth in the am and one half tablet in the pm  The following medications were removed from the medication list:    Atenolol 25 Mg Tabs (Atenolol) ..... One tablet by mouth twice daily Her updated medication list for this problem includes:    Aspirin 81 Mg Tbec (Aspirin) .Marland Kitchen... Take one tablet by mouth daily    Furosemide 40 Mg Tabs (Furosemide) ..... One tab by mouth in the am and one half tablet in the pm  Problem # 6:  AORTIC STENOSIS/ INSUFFICIENCY, NON-RHEUMATIC (ICD-424.1) Repeat echocardiogram as described above. The following medications were removed from the medication list:    Atenolol 25 Mg Tabs (Atenolol) ..... One tablet by mouth twice daily Her updated medication list for this problem includes:    Isosorbide Mononitrate Cr 30 Mg Xr24h-tab (Isosorbide mononitrate) .Marland KitchenMarland KitchenMarland KitchenMarland Kitchen 3 tabs daily    Nitrostat 0.4 Mg Subl (Nitroglycerin) .Marland Kitchen... 1 tablet under tongue at onset of chest pain; you may repeat every 5 minutes for up to 3 doses.    Furosemide 40 Mg Tabs (Furosemide) ..... One tab by mouth in the am and one half tablet in the pm  Orders: Echocardiogram (Echo)  The following medications were removed from the medication list:    Atenolol 25 Mg Tabs (Atenolol) ..... One tablet by mouth twice daily Her updated medication list for this problem includes:    Isosorbide Mononitrate Cr 30 Mg Xr24h-tab (Isosorbide mononitrate) .Marland KitchenMarland KitchenMarland KitchenMarland Kitchen 3 tabs daily    Nitrostat 0.4 Mg Subl (Nitroglycerin) .Marland Kitchen... 1 tablet under tongue at onset of chest pain; you may repeat every 5 minutes for up to 3 doses.    Furosemide 40 Mg Tabs (Furosemide) ..... One tab by mouth in the am and one half tablet in the  pm  Problem # 7:  CAD, AUTOLOGOUS BYPASS GRAFT (ICD-414.02) Continue aspirin and statin. The following medications were removed from the medication list:    Atenolol 25 Mg Tabs (Atenolol) ..... One tablet by mouth twice daily Her updated medication list for this problem includes:    Isosorbide Mononitrate Cr 30 Mg Xr24h-tab (Isosorbide mononitrate) .Marland KitchenMarland KitchenMarland KitchenMarland Kitchen 3 tabs daily    Aspirin 81 Mg Tbec (Aspirin) .Marland Kitchen... Take one tablet by mouth daily    Nitrostat 0.4 Mg Subl (Nitroglycerin) .Marland Kitchen... 1 tablet under tongue at onset of chest pain; you may repeat every 5 minutes for up to 3 doses.  The following medications were removed from the medication list:    Atenolol 25 Mg Tabs (Atenolol) ..... One tablet by mouth twice daily Her updated medication list for this problem includes:    Isosorbide Mononitrate Cr 30 Mg Xr24h-tab (Isosorbide mononitrate) .Marland KitchenMarland KitchenMarland KitchenMarland Kitchen 3 tabs daily    Aspirin 81 Mg Tbec (Aspirin) .Marland Kitchen... Take one tablet by mouth  daily    Nitrostat 0.4 Mg Subl (Nitroglycerin) .Marland Kitchen... 1 tablet under tongue at onset of chest pain; you may repeat every 5 minutes for up to 3 doses.  Other Orders: T-BNP  (B Natriuretic Peptide) [phone])  Patient Instructions: 1)  Your physician recommends that you schedule a follow-up appointment in: 3 MONTHS 2)  Your physician has requested that you have an echocardiogram.  Echocardiography is a painless test that uses sound waves to create images of your heart. It provides your doctor with information about the size and shape of your heart and how well your heart's chambers and valves are working.  This procedure takes approximately one hour. There are no restrictions for this procedure. 3)  Your physician recommends that you return for lab work in:6 WEEKS-FASTING 4)  Your physician has recommended you make the following change in your medication: STOP SIMVASTATIN 5)  START PRAVASTATIN 80MG  ONCE DAILY 6)  STOP COZAAR Prescriptions: PRAVASTATIN SODIUM 80 MG TABS  (PRAVASTATIN SODIUM) Take one tablet by mouth daily at bedtime  #30 x 12   Entered by:   Deliah Goody, RN   Authorized by:   Ferman Hamming, MD, [facility]   Signed by:   Deliah Goody, RN on 11/27/2009   Method used:   Electronically to        [facility] (retail)       [address]       [place], [place]  [address]       Ph: [phone]       Fax: [phone]   RxID:   [id number]

## 2010-07-10 LAB — URINALYSIS, ROUTINE W REFLEX MICROSCOPIC
Bilirubin Urine: NEGATIVE
Hgb urine dipstick: NEGATIVE
Protein, ur: NEGATIVE mg/dL
Urobilinogen, UA: 0.2 mg/dL (ref 0.0–1.0)

## 2010-07-10 LAB — URINE CULTURE
Colony Count: NO GROWTH
Culture: NO GROWTH
Special Requests: NEGATIVE

## 2010-07-10 LAB — HEMOGLOBIN A1C
Hgb A1c MFr Bld: 7.9 % — ABNORMAL HIGH (ref <5.7)
Mean Plasma Glucose: 180 mg/dL — ABNORMAL HIGH (ref <117)

## 2010-07-10 LAB — CBC
HCT: 26 % — ABNORMAL LOW (ref 36.0–46.0)
HCT: 26.1 % — ABNORMAL LOW (ref 36.0–46.0)
HCT: 32.1 % — ABNORMAL LOW (ref 36.0–46.0)
HCT: 36.2 % (ref 36.0–46.0)
Hemoglobin: 12.4 g/dL (ref 12.0–15.0)
Hemoglobin: 9 g/dL — ABNORMAL LOW (ref 12.0–15.0)
Hemoglobin: 9.4 g/dL — ABNORMAL LOW (ref 12.0–15.0)
MCH: 31.5 pg (ref 26.0–34.0)
MCH: 31.7 pg (ref 26.0–34.0)
MCH: 31.9 pg (ref 26.0–34.0)
MCH: 32.1 pg (ref 26.0–34.0)
MCHC: 34.3 g/dL (ref 30.0–36.0)
MCHC: 34.4 g/dL (ref 30.0–36.0)
MCHC: 34.5 g/dL (ref 30.0–36.0)
MCHC: 34.8 g/dL (ref 30.0–36.0)
MCV: 91.7 fL (ref 78.0–100.0)
MCV: 91.8 fL (ref 78.0–100.0)
MCV: 92.8 fL (ref 78.0–100.0)
Platelets: 176 10*3/uL (ref 150–400)
Platelets: 176 10*3/uL (ref 150–400)
Platelets: 184 10*3/uL (ref 150–400)
Platelets: 208 10*3/uL (ref 150–400)
RBC: 2.82 MIL/uL — ABNORMAL LOW (ref 3.87–5.11)
RBC: 2.97 MIL/uL — ABNORMAL LOW (ref 3.87–5.11)
RDW: 14.3 % (ref 11.5–15.5)
RDW: 14.4 % (ref 11.5–15.5)
RDW: 14.6 % (ref 11.5–15.5)
RDW: 14.7 % (ref 11.5–15.5)
RDW: 15.2 % (ref 11.5–15.5)
WBC: 14.7 10*3/uL — ABNORMAL HIGH (ref 4.0–10.5)
WBC: 19.8 10*3/uL — ABNORMAL HIGH (ref 4.0–10.5)
WBC: 21.9 10*3/uL — ABNORMAL HIGH (ref 4.0–10.5)

## 2010-07-10 LAB — BASIC METABOLIC PANEL
BUN: 20 mg/dL (ref 6–23)
BUN: 21 mg/dL (ref 6–23)
BUN: 29 mg/dL — ABNORMAL HIGH (ref 6–23)
BUN: 32 mg/dL — ABNORMAL HIGH (ref 6–23)
CO2: 27 mEq/L (ref 19–32)
CO2: 31 mEq/L (ref 19–32)
CO2: 31 mEq/L (ref 19–32)
CO2: 32 mEq/L (ref 19–32)
Calcium: 8.3 mg/dL — ABNORMAL LOW (ref 8.4–10.5)
Calcium: 8.6 mg/dL (ref 8.4–10.5)
Calcium: 8.7 mg/dL (ref 8.4–10.5)
Chloride: 94 mEq/L — ABNORMAL LOW (ref 96–112)
Chloride: 97 mEq/L (ref 96–112)
Chloride: 98 mEq/L (ref 96–112)
Creatinine, Ser: 0.83 mg/dL (ref 0.4–1.2)
Creatinine, Ser: 1.08 mg/dL (ref 0.4–1.2)
GFR calc Af Amer: 38 mL/min — ABNORMAL LOW (ref >60)
GFR calc Af Amer: 59 mL/min — ABNORMAL LOW (ref >60)
GFR calc Af Amer: >60 mL/min (ref >60)
GFR calc non Af Amer: 31 mL/min — ABNORMAL LOW (ref >60)
GFR calc non Af Amer: 47 mL/min — ABNORMAL LOW (ref >60)
GFR calc non Af Amer: 49 mL/min — ABNORMAL LOW (ref >60)
GFR calc non Af Amer: >60 mL/min (ref >60)
GFR calc non Af Amer: >60 mL/min (ref >60)
Glucose, Bld: 207 mg/dL — ABNORMAL HIGH (ref 70–99)
Glucose, Bld: 257 mg/dL — ABNORMAL HIGH (ref 70–99)
Potassium: 3.8 mEq/L (ref 3.5–5.1)
Potassium: 4.7 mEq/L (ref 3.5–5.1)
Potassium: 5.2 mEq/L — ABNORMAL HIGH (ref 3.5–5.1)
Potassium: 5.4 mEq/L — ABNORMAL HIGH (ref 3.5–5.1)
Potassium: 5.4 mEq/L — ABNORMAL HIGH (ref 3.5–5.1)
Sodium: 135 mEq/L (ref 135–145)
Sodium: 136 mEq/L (ref 135–145)
Sodium: 138 mEq/L (ref 135–145)
Sodium: 139 mEq/L (ref 135–145)

## 2010-07-10 LAB — PROTIME-INR
INR: 1.32 (ref 0.00–1.49)
INR: 2.06 — ABNORMAL HIGH (ref 0.00–1.49)
Prothrombin Time: 23 seconds — ABNORMAL HIGH (ref 11.6–15.2)

## 2010-07-10 LAB — ABO/RH: ABO/RH(D): A POS

## 2010-07-10 LAB — GLUCOSE, CAPILLARY
Glucose-Capillary: 133 mg/dL — ABNORMAL HIGH (ref 70–99)
Glucose-Capillary: 149 mg/dL — ABNORMAL HIGH (ref 70–99)
Glucose-Capillary: 149 mg/dL — ABNORMAL HIGH (ref 70–99)
Glucose-Capillary: 161 mg/dL — ABNORMAL HIGH (ref 70–99)
Glucose-Capillary: 190 mg/dL — ABNORMAL HIGH (ref 70–99)
Glucose-Capillary: 215 mg/dL — ABNORMAL HIGH (ref 70–99)
Glucose-Capillary: 234 mg/dL — ABNORMAL HIGH (ref 70–99)
Glucose-Capillary: 269 mg/dL — ABNORMAL HIGH (ref 70–99)
Glucose-Capillary: 286 mg/dL — ABNORMAL HIGH (ref 70–99)
Glucose-Capillary: 341 mg/dL — ABNORMAL HIGH (ref 70–99)
Glucose-Capillary: 75 mg/dL (ref 70–99)
Glucose-Capillary: 99 mg/dL (ref 70–99)

## 2010-07-10 LAB — DIFFERENTIAL
Basophils Absolute: 0.1 10*3/uL (ref 0.0–0.1)
Basophils Relative: 0 % (ref 0–1)
Basophils Relative: 0 % (ref 0–1)
Eosinophils Absolute: 0.1 10*3/uL (ref 0.0–0.7)
Eosinophils Absolute: 0.3 10*3/uL (ref 0.0–0.7)
Monocytes Absolute: 0.5 10*3/uL (ref 0.1–1.0)
Monocytes Relative: 3 % (ref 3–12)
Monocytes Relative: 5 % (ref 3–12)
Neutro Abs: 13 10*3/uL — ABNORMAL HIGH (ref 1.7–7.7)
Neutrophils Relative %: 77 % (ref 43–77)

## 2010-07-10 LAB — CROSSMATCH: Antibody Screen: NEGATIVE

## 2010-07-10 LAB — APTT: aPTT: 31 seconds (ref 24–37)

## 2010-07-14 LAB — GLUCOSE, CAPILLARY
Glucose-Capillary: 140 mg/dL — ABNORMAL HIGH (ref 70–99)
Glucose-Capillary: 161 mg/dL — ABNORMAL HIGH (ref 70–99)
Glucose-Capillary: 168 mg/dL — ABNORMAL HIGH (ref 70–99)
Glucose-Capillary: 168 mg/dL — ABNORMAL HIGH (ref 70–99)
Glucose-Capillary: 177 mg/dL — ABNORMAL HIGH (ref 70–99)
Glucose-Capillary: 180 mg/dL — ABNORMAL HIGH (ref 70–99)
Glucose-Capillary: 203 mg/dL — ABNORMAL HIGH (ref 70–99)
Glucose-Capillary: 215 mg/dL — ABNORMAL HIGH (ref 70–99)
Glucose-Capillary: 220 mg/dL — ABNORMAL HIGH (ref 70–99)

## 2010-07-14 LAB — CK TOTAL AND CKMB (NOT AT ARMC)
CK, MB: 3.2 ng/mL (ref 0.3–4.0)
Relative Index: 2 (ref 0.0–2.5)
Total CK: 161 U/L (ref 7–177)

## 2010-07-14 LAB — CARDIAC PANEL(CRET KIN+CKTOT+MB+TROPI)
CK, MB: 4.2 ng/mL — ABNORMAL HIGH (ref 0.3–4.0)
CK, MB: 4.3 ng/mL — ABNORMAL HIGH (ref 0.3–4.0)
Relative Index: 1.3 (ref 0.0–2.5)
Total CK: 423 U/L — ABNORMAL HIGH (ref 7–177)
Troponin I: 0.03 ng/mL (ref 0.00–0.06)
Troponin I: 0.06 ng/mL (ref 0.00–0.06)

## 2010-07-14 LAB — BASIC METABOLIC PANEL
BUN: 18 mg/dL (ref 6–23)
Calcium: 9.4 mg/dL (ref 8.4–10.5)
Chloride: 103 mEq/L (ref 96–112)
Creatinine, Ser: 0.85 mg/dL (ref 0.4–1.2)
GFR calc Af Amer: >60 mL/min (ref >60)
GFR calc non Af Amer: >60 mL/min (ref >60)
GFR calc non Af Amer: >60 mL/min (ref >60)
Potassium: 4.8 mEq/L (ref 3.5–5.1)
Potassium: 5.4 mEq/L — ABNORMAL HIGH (ref 3.5–5.1)
Sodium: 142 mEq/L (ref 135–145)

## 2010-07-14 LAB — COMPREHENSIVE METABOLIC PANEL
ALT: 17 U/L (ref 0–35)
Albumin: 3.5 g/dL (ref 3.5–5.2)
Alkaline Phosphatase: 60 U/L (ref 39–117)
BUN: 19 mg/dL (ref 6–23)
Calcium: 9.2 mg/dL (ref 8.4–10.5)
Calcium: 9.5 mg/dL (ref 8.4–10.5)
GFR calc Af Amer: 57 mL/min — ABNORMAL LOW (ref >60)
GFR calc non Af Amer: 56 mL/min — ABNORMAL LOW (ref >60)
Glucose, Bld: 144 mg/dL — ABNORMAL HIGH (ref 70–99)
Glucose, Bld: 156 mg/dL — ABNORMAL HIGH (ref 70–99)
Potassium: 4.6 mEq/L (ref 3.5–5.1)
Sodium: 137 mEq/L (ref 135–145)
Total Protein: 6.4 g/dL (ref 6.0–8.3)
Total Protein: 6.8 g/dL (ref 6.0–8.3)

## 2010-07-14 LAB — POCT I-STAT, CHEM 8
Calcium, Ion: 1.16 mmol/L (ref 1.12–1.32)
Creatinine, Ser: 1.1 mg/dL (ref 0.4–1.2)
Glucose, Bld: 209 mg/dL — ABNORMAL HIGH (ref 70–99)
HCT: 36 % (ref 36.0–46.0)
Hemoglobin: 12.2 g/dL (ref 12.0–15.0)
TCO2: 30 mmol/L (ref 0–100)

## 2010-07-14 LAB — LIPID PANEL
Cholesterol: 194 mg/dL (ref 0–200)
LDL Cholesterol: 85 mg/dL (ref 0–99)
Total CHOL/HDL Ratio: 3.9 RATIO

## 2010-07-14 LAB — HEPATIC FUNCTION PANEL
Bilirubin, Direct: 0.1 mg/dL (ref 0.0–0.3)
Total Bilirubin: 0.5 mg/dL (ref 0.3–1.2)

## 2010-07-14 LAB — CBC
HCT: 35.4 % — ABNORMAL LOW (ref 36.0–46.0)
MCHC: 33.5 g/dL (ref 30.0–36.0)
Platelets: 192 10*3/uL (ref 150–400)
Platelets: 211 10*3/uL (ref 150–400)
RDW: 14.2 % (ref 11.5–15.5)
RDW: 14.3 % (ref 11.5–15.5)
WBC: 10.8 10*3/uL — ABNORMAL HIGH (ref 4.0–10.5)

## 2010-07-14 LAB — DIFFERENTIAL
Eosinophils Absolute: 0.2 10*3/uL (ref 0.0–0.7)
Lymphs Abs: 3.2 10*3/uL (ref 0.7–4.0)
Monocytes Absolute: 0.9 10*3/uL (ref 0.1–1.0)
Monocytes Relative: 6 % (ref 3–12)
Neutrophils Relative %: 71 % (ref 43–77)

## 2010-07-14 LAB — PROTIME-INR
INR: 1.04 (ref 0.00–1.49)
Prothrombin Time: 13.5 seconds (ref 11.6–15.2)

## 2010-07-14 LAB — URINALYSIS, ROUTINE W REFLEX MICROSCOPIC
Bilirubin Urine: NEGATIVE
Hgb urine dipstick: NEGATIVE
Nitrite: NEGATIVE
Specific Gravity, Urine: 1.015 (ref 1.005–1.030)
Urobilinogen, UA: 0.2 mg/dL (ref 0.0–1.0)
pH: 5 (ref 5.0–8.0)

## 2010-07-14 LAB — HEMOGLOBIN A1C
Hgb A1c MFr Bld: 7.2 % — ABNORMAL HIGH (ref <5.7)
Mean Plasma Glucose: 160 mg/dL — ABNORMAL HIGH (ref <117)

## 2010-07-14 LAB — APTT: aPTT: 28 seconds (ref 24–37)

## 2010-07-14 LAB — SEDIMENTATION RATE: Sed Rate: 49 mm/hr — ABNORMAL HIGH (ref 0–22)

## 2010-07-18 LAB — DIFFERENTIAL
Basophils Relative: 0 % (ref 0–1)
Eosinophils Absolute: 0.1 10*3/uL (ref 0.0–0.7)
Eosinophils Absolute: 0.2 10*3/uL (ref 0.0–0.7)
Eosinophils Relative: 1 % (ref 0–5)
Lymphocytes Relative: 15 % (ref 12–46)
Lymphs Abs: 1.9 10*3/uL (ref 0.7–4.0)
Lymphs Abs: 2.1 10*3/uL (ref 0.7–4.0)
Monocytes Absolute: 0.7 10*3/uL (ref 0.1–1.0)
Monocytes Absolute: 0.7 10*3/uL (ref 0.1–1.0)
Monocytes Relative: 5 % (ref 3–12)
Monocytes Relative: 6 % (ref 3–12)
Neutro Abs: 10.1 10*3/uL — ABNORMAL HIGH (ref 1.7–7.7)

## 2010-07-18 LAB — BASIC METABOLIC PANEL
BUN: 27 mg/dL — ABNORMAL HIGH (ref 6–23)
CO2: 28 mEq/L (ref 19–32)
CO2: 30 mEq/L (ref 19–32)
CO2: 30 mEq/L (ref 19–32)
CO2: 39 mEq/L — ABNORMAL HIGH (ref 19–32)
Calcium: 7.8 mg/dL — ABNORMAL LOW (ref 8.4–10.5)
Calcium: 8.7 mg/dL (ref 8.4–10.5)
Chloride: 113 mEq/L — ABNORMAL HIGH (ref 96–112)
Creatinine, Ser: 0.84 mg/dL (ref 0.4–1.2)
Creatinine, Ser: 1.84 mg/dL — ABNORMAL HIGH (ref 0.4–1.2)
GFR calc Af Amer: >60 mL/min (ref >60)
GFR calc Af Amer: >60 mL/min (ref >60)
GFR calc non Af Amer: 60 mL/min — ABNORMAL LOW (ref >60)
GFR calc non Af Amer: >60 mL/min (ref >60)
Glucose, Bld: 127 mg/dL — ABNORMAL HIGH (ref 70–99)
Glucose, Bld: 146 mg/dL — ABNORMAL HIGH (ref 70–99)
Glucose, Bld: 172 mg/dL — ABNORMAL HIGH (ref 70–99)
Glucose, Bld: 193 mg/dL — ABNORMAL HIGH (ref 70–99)
Potassium: 3.7 mEq/L (ref 3.5–5.1)
Potassium: 3.9 mEq/L (ref 3.5–5.1)
Sodium: 141 mEq/L (ref 135–145)
Sodium: 146 mEq/L — ABNORMAL HIGH (ref 135–145)
Sodium: 147 mEq/L — ABNORMAL HIGH (ref 135–145)

## 2010-07-18 LAB — CK TOTAL AND CKMB (NOT AT ARMC)
CK, MB: 49 ng/mL (ref 0.3–4.0)
Relative Index: 0.7 (ref 0.0–2.5)
Relative Index: 2.2 (ref 0.0–2.5)
Total CK: 2201 U/L — ABNORMAL HIGH (ref 7–177)

## 2010-07-18 LAB — COMPREHENSIVE METABOLIC PANEL
ALT: 23 U/L (ref 0–35)
AST: 50 U/L — ABNORMAL HIGH (ref 0–37)
Albumin: 3.4 g/dL — ABNORMAL LOW (ref 3.5–5.2)
Calcium: 8.1 mg/dL — ABNORMAL LOW (ref 8.4–10.5)
Creatinine, Ser: 2.37 mg/dL — ABNORMAL HIGH (ref 0.4–1.2)
GFR calc Af Amer: 24 mL/min — ABNORMAL LOW (ref >60)
Sodium: 145 mEq/L (ref 135–145)
Total Protein: 6.5 g/dL (ref 6.0–8.3)

## 2010-07-18 LAB — T4, FREE: Free T4: 1.08 ng/dL (ref 0.80–1.80)

## 2010-07-18 LAB — GLUCOSE, CAPILLARY
Glucose-Capillary: 108 mg/dL — ABNORMAL HIGH (ref 70–99)
Glucose-Capillary: 112 mg/dL — ABNORMAL HIGH (ref 70–99)
Glucose-Capillary: 124 mg/dL — ABNORMAL HIGH (ref 70–99)
Glucose-Capillary: 146 mg/dL — ABNORMAL HIGH (ref 70–99)
Glucose-Capillary: 147 mg/dL — ABNORMAL HIGH (ref 70–99)
Glucose-Capillary: 155 mg/dL — ABNORMAL HIGH (ref 70–99)
Glucose-Capillary: 160 mg/dL — ABNORMAL HIGH (ref 70–99)
Glucose-Capillary: 170 mg/dL — ABNORMAL HIGH (ref 70–99)
Glucose-Capillary: 178 mg/dL — ABNORMAL HIGH (ref 70–99)
Glucose-Capillary: 179 mg/dL — ABNORMAL HIGH (ref 70–99)
Glucose-Capillary: 179 mg/dL — ABNORMAL HIGH (ref 70–99)
Glucose-Capillary: 195 mg/dL — ABNORMAL HIGH (ref 70–99)
Glucose-Capillary: 196 mg/dL — ABNORMAL HIGH (ref 70–99)
Glucose-Capillary: 210 mg/dL — ABNORMAL HIGH (ref 70–99)

## 2010-07-18 LAB — POCT CARDIAC MARKERS
CKMB, poc: 45.7 ng/mL (ref 1.0–8.0)
Myoglobin, poc: >500 ng/mL (ref 12–200)
Troponin i, poc: <0.05 ng/mL (ref 0.00–0.09)

## 2010-07-18 LAB — URINALYSIS, ROUTINE W REFLEX MICROSCOPIC
Glucose, UA: NEGATIVE mg/dL
Ketones, ur: NEGATIVE mg/dL
Protein, ur: NEGATIVE mg/dL
Urobilinogen, UA: 0.2 mg/dL (ref 0.0–1.0)

## 2010-07-18 LAB — CK
Total CK: 2029 U/L — ABNORMAL HIGH (ref 7–177)
Total CK: 256 U/L — ABNORMAL HIGH (ref 7–177)
Total CK: 459 U/L — ABNORMAL HIGH (ref 7–177)

## 2010-07-18 LAB — CARDIAC PANEL(CRET KIN+CKTOT+MB+TROPI)
CK, MB: 27.4 ng/mL (ref 0.3–4.0)
Relative Index: 1.1 (ref 0.0–2.5)
Total CK: 2499 U/L — ABNORMAL HIGH (ref 7–177)

## 2010-07-18 LAB — MAGNESIUM: Magnesium: 2 mg/dL (ref 1.5–2.5)

## 2010-07-18 LAB — URINE MICROSCOPIC-ADD ON

## 2010-07-18 LAB — CBC
Hemoglobin: 11.1 g/dL — ABNORMAL LOW (ref 12.0–15.0)
MCHC: 34.1 g/dL (ref 30.0–36.0)
MCHC: 34.6 g/dL (ref 30.0–36.0)
MCV: 92.6 fL (ref 78.0–100.0)
MCV: 93.5 fL (ref 78.0–100.0)
Platelets: 160 10*3/uL (ref 150–400)
RBC: 3.47 MIL/uL — ABNORMAL LOW (ref 3.87–5.11)
RBC: 3.73 MIL/uL — ABNORMAL LOW (ref 3.87–5.11)
WBC: 13 10*3/uL — ABNORMAL HIGH (ref 4.0–10.5)

## 2010-07-18 LAB — TROPONIN I: Troponin I: 0.04 ng/mL (ref 0.00–0.06)

## 2010-07-18 LAB — URINE CULTURE
Colony Count: NO GROWTH
Culture: NO GROWTH

## 2010-07-18 LAB — PHOSPHORUS: Phosphorus: 2.5 mg/dL (ref 2.3–4.6)

## 2010-07-31 LAB — POCT I-STAT 3, VENOUS BLOOD GAS (G3P V)
Bicarbonate: 35 mEq/L — ABNORMAL HIGH (ref 20.0–24.0)
O2 Saturation: 72 %
TCO2: 37 mmol/L (ref 0–100)

## 2010-07-31 LAB — COMPREHENSIVE METABOLIC PANEL
AST: 27 U/L (ref 0–37)
Albumin: 3.5 g/dL (ref 3.5–5.2)
Alkaline Phosphatase: 59 U/L (ref 39–117)
CO2: 28 mEq/L (ref 19–32)
Chloride: 95 mEq/L — ABNORMAL LOW (ref 96–112)
Creatinine, Ser: 1.53 mg/dL — ABNORMAL HIGH (ref 0.4–1.2)
GFR calc Af Amer: 40 mL/min — ABNORMAL LOW (ref >60)
GFR calc non Af Amer: 33 mL/min — ABNORMAL LOW (ref >60)
Potassium: 4.5 mEq/L (ref 3.5–5.1)
Total Bilirubin: 0.6 mg/dL (ref 0.3–1.2)

## 2010-07-31 LAB — BASIC METABOLIC PANEL
BUN: 49 mg/dL — ABNORMAL HIGH (ref 6–23)
CO2: 30 mEq/L (ref 19–32)
CO2: 30 mEq/L (ref 19–32)
CO2: 31 mEq/L (ref 19–32)
Calcium: 7.8 mg/dL — ABNORMAL LOW (ref 8.4–10.5)
Calcium: 7.8 mg/dL — ABNORMAL LOW (ref 8.4–10.5)
Calcium: 8.2 mg/dL — ABNORMAL LOW (ref 8.4–10.5)
Chloride: 102 mEq/L (ref 96–112)
Chloride: 93 mEq/L — ABNORMAL LOW (ref 96–112)
Chloride: 94 mEq/L — ABNORMAL LOW (ref 96–112)
Creatinine, Ser: 1.65 mg/dL — ABNORMAL HIGH (ref 0.4–1.2)
Creatinine, Ser: 2.78 mg/dL — ABNORMAL HIGH (ref 0.4–1.2)
GFR calc Af Amer: 19 mL/min — ABNORMAL LOW (ref >60)
GFR calc Af Amer: 20 mL/min — ABNORMAL LOW (ref >60)
Glucose, Bld: 204 mg/dL — ABNORMAL HIGH (ref 70–99)
Glucose, Bld: 65 mg/dL — ABNORMAL LOW (ref 70–99)
Glucose, Bld: 70 mg/dL (ref 70–99)
Potassium: 4.4 mEq/L (ref 3.5–5.1)
Potassium: 4.9 mEq/L (ref 3.5–5.1)
Sodium: 130 mEq/L — ABNORMAL LOW (ref 135–145)
Sodium: 133 mEq/L — ABNORMAL LOW (ref 135–145)
Sodium: 140 mEq/L (ref 135–145)

## 2010-07-31 LAB — GLUCOSE, CAPILLARY
Glucose-Capillary: 101 mg/dL — ABNORMAL HIGH (ref 70–99)
Glucose-Capillary: 121 mg/dL — ABNORMAL HIGH (ref 70–99)
Glucose-Capillary: 122 mg/dL — ABNORMAL HIGH (ref 70–99)
Glucose-Capillary: 146 mg/dL — ABNORMAL HIGH (ref 70–99)
Glucose-Capillary: 152 mg/dL — ABNORMAL HIGH (ref 70–99)
Glucose-Capillary: 169 mg/dL — ABNORMAL HIGH (ref 70–99)
Glucose-Capillary: 191 mg/dL — ABNORMAL HIGH (ref 70–99)
Glucose-Capillary: 83 mg/dL (ref 70–99)
Glucose-Capillary: 90 mg/dL (ref 70–99)

## 2010-07-31 LAB — CBC
HCT: 35.2 % — ABNORMAL LOW (ref 36.0–46.0)
HCT: 39.7 % (ref 36.0–46.0)
Hemoglobin: 11.8 g/dL — ABNORMAL LOW (ref 12.0–15.0)
Hemoglobin: 13.2 g/dL (ref 12.0–15.0)
MCHC: 33.3 g/dL (ref 30.0–36.0)
MCHC: 33.6 g/dL (ref 30.0–36.0)
MCV: 91.3 fL (ref 78.0–100.0)
MCV: 92.3 fL (ref 78.0–100.0)
Platelets: 177 10*3/uL (ref 150–400)
RBC: 3.85 MIL/uL — ABNORMAL LOW (ref 3.87–5.11)
RBC: 4.36 MIL/uL (ref 3.87–5.11)
RDW: 14.5 % (ref 11.5–15.5)
WBC: 12.4 10*3/uL — ABNORMAL HIGH (ref 4.0–10.5)

## 2010-07-31 LAB — POCT I-STAT 3, ART BLOOD GAS (G3+)
Bicarbonate: 35.6 mEq/L — ABNORMAL HIGH (ref 20.0–24.0)
O2 Saturation: 97 %
TCO2: 38 mmol/L (ref 0–100)
pCO2 arterial: 64.8 mmHg (ref 35.0–45.0)

## 2010-07-31 LAB — DIFFERENTIAL
Basophils Relative: 0 % (ref 0–1)
Eosinophils Relative: 4 % (ref 0–5)
Monocytes Absolute: 0.4 10*3/uL (ref 0.1–1.0)
Monocytes Relative: 4 % (ref 3–12)
Neutro Abs: 9.3 10*3/uL — ABNORMAL HIGH (ref 1.7–7.7)

## 2010-09-09 NOTE — Assessment & Plan Note (Signed)
[facility]                            CARDIOLOGY OFFICE NOTE   [name], [name]                     MRN:          [id number]  DATE:01/19/2007                            DOB:          Jul 08, 1929    [name] is a pleasant female who has a history of coronary disease  status post coronary artery bypassing graft as well as chronic chest  pain.  She did undergo coronary artery bypassing graft in 1996.  She has  had intermittent chest pain since that time.  Her most recent  catheterization was performed in August of 2006 and showed patent  grafts.  Her most recent Myoview was performed on December 14, 2005.  She  had normal perfusion at that time with an ejection fraction of 67%.  Since I last saw her she is doing essentially the same.  She continues  to have occasional chest pain.  It is in the substernal area and  described as a sharp pain.  It does not radiate.  It is not pleuritic or  positional nor is it exertional.  She does take nitroglycerin for this  pain.  She also has some dyspnea on exertion that she thinks is worse.  There is no orthopnea or PND but there can be mild pedal edema.   MEDICATIONS:  1. Insulin.  2. Aspirin 325 mg p.o. daily.  3. Pepcid 20 mg p.o. b.i.d.  4. Atenolol 100 mg p.o. daily.  5. Norvasc 2.5 mg p.o. daily.  6. Imdur 90 mg p.o. daily.  7. Clonazepam 1 mg p.o. q.h.s.  8. Altace 10 mg p.o. daily.  9. Lyrica 150 mg p.o. b.i.d.  10.Allopurinol.  11.Zocor of uncertain dose.  12.Lasix 40 mg p.o. daily.  13.Potassium 20 mEq p.o. daily.  14.Plavix 75 mg p.o. daily.  15.Abilify 2 mg p.o. daily.   PHYSICAL EXAM:  Today, shows a blood pressure of 154/75 and her pulse is  73.  She weighs 214 pounds.  HEENT:  Normal.  NECK:  Supple with no bruits.  CHEST:  Clear.  CARDIOVASCULAR EXAM:  Regular rate and rhythm.  There is a 2-3/6  systolic murmur at the left sternal border.  S2 is not diminished.  There is also a 2/6  systolic murmur at the apex.  Abdominal exam shows  obesity.  EXTREMITIES:  Show trace to 1+ edema bilaterally.   Electrocardiogram shows a sinus rhythm at a rate of 73.  There are no  significant ST changes noted.   DIAGNOSES:  1. Chest pain - Ms. Montecalvo continues to have chest pain.  Her      electrocardiogram is normal and this has been a chronic issue.  We      will plan to proceed with a Myoview for risk stratification.  If it      shows normal perfusion then we will continue with medical therapy.  2. Coronary artery disease status post coronary bypassing graft - She      will continue on her aspirin, her Plavix, beta-blocker, statin and      ACE inhibitor.  3. Diabetes mellitus -  This is being managed by Dr. Drue Second.  4. Hypertension - Her blood pressure is mildly elevated today.  Her      Norvasc could be increased in the future if it remains elevated.  5. Hyperlipidemia - We will have her most recent lipids and liver      forwarded to [place] from Dr. Feliz Beam office.  We will also have her      most recent BMET, given her diuretic use.  6. Peripheral neuropathy.  7. Systolic murmurs on examination - We will check an echocardiogram.   If her echo and Myoview are unremarkable then we will plan to see her  back in 6 months.     Madolyn Frieze Jens Som, MD, [facility]  Electronically Signed    BSC/MedQ  DD: 01/19/2007  DT: 01/20/2007  Job #: [id number]   cc:   Dalbert Mayotte, M.D.

## 2010-09-09 NOTE — Assessment & Plan Note (Signed)
[facility]                            CARDIOLOGY OFFICE NOTE   SHARESA, KEMP                     MRN:          [id number]  DATE:09/21/2007                            DOB:          1929/07/23    Mrs. Attwood is a pleasant female who has a history of coronary disease  status post bypass graft in 1996.  She has had chronic intermittent  chest pain since then.  Her most recent Myoview was performed on February 08, 2007.  At that time her LV function was normal with an estimated  ejection fraction of 68%.  There was no ischemia or infarction.  She  also had an echocardiogram on February 08, 2008.  At that time her LV  function was normal.  There was mild aortic stenosis with a mean  gradient 13 mmHg.  There was trivial aortic insufficiency.  There is  mild mitral regurgitation.  Left atrium was mild to mildly dilated, the  right ventricle was mildly dilated, the right atrium is mildly dilated.  There was mild tricuspid regurgitation.  Since I last saw her she  continues to have occasional chest pain.  The pain is in the substernal  area.  It is not exertional.  It is not pleuritic or positional nor is  it related to food.  Lasts seconds.  She does occasionally take  nitroglycerin.  Note, it is unchanged in frequency or severity.  She  also has mild dyspnea on exertion which has been a chronic issue but  there is no orthopnea or PND.  She occasionally has mild pedal edema.  She does complain of pain in her lower extremities bilaterally at rest  which she attributes to her diabetes.  Medications include insulin,  aspirin 325 mg p.o. daily, Pepcid 20 mg p.o. b.i.d. atenolol 100 mg  daily, Norvasc 2.5 mg daily, Imdur 90 mg daily, clonazepam 1 mg p.o.  nightly, Altace 10 mg daily of 150 mg p.o. b.i.d., allopurinol 3 mg  daily, and Zocor unknown dose.  She takes Lasix 40 mg p.o. daily,  potassium 20 mEq p.o. b.i.d. and Plavix 75 mg daily.   PHYSICAL  EXAM:  Shows a blood pressure 125/63 and pulse of 71.  She  weighs 213 pounds.  HEENT:  Normal.  NECK:  Supple.  No bruits.  CHEST:  Clear.  CARDIOVASCULAR:  Regular rate and rhythm.  There is 2/6 systolic murmur  at the left sternal border.  S2 is not diminished.  ABDOMEN:  Exam shows no tenderness.  EXTREMITIES:  Show trace edema bilaterally.   Electrocardiogram shows sinus rhythm at a rate of 67.  The axis is  normal.  There are no significant ST changes.   DIAGNOSES:  1. Chest pain - Mrs. Jobst continues to have chest pain and this      has been present intermittently since her bypass surgery.  Her      Myoview approximately 7 months ago showed no ischemia or      infarction.  Her electrocardiogram shows no ST changes.  It does      not  sound to be cardiac.  We will continue with medical therapy.  2. Coronary disease status post bypass graft - she will continue on      Plavix, beta-blocker, statin, and ACE inhibitor.  3. Diabetes mellitus - management per Dr. Drue Second.  4. Hypertension - her blood pressure is controlled on her present      medications.  We will have her most recent BMET forwarded to [place]      from Dr. Feliz Beam office.  5. Hyperlipidemia - she will continue on her statin and we will have      her most recent lipids and liver forwarded to [place] from Dr. Feliz Beam      office.  Her goal LDL should be less 70 given her history of      coronary disease.  6. Peripheral neuropathy.  7. History of mild aortic stenosis.   I will see her back in 6 months.  We will most likely repeat her Myoview  at that time.     Madolyn Frieze Jens Som, MD, [facility]  Electronically Signed    BSC/MedQ  DD: 09/21/2007  DT: 09/21/2007  Job #: [id number]   cc:   Dalbert Mayotte, M.D.

## 2010-09-09 NOTE — Assessment & Plan Note (Signed)
[facility]                            CARDIOLOGY OFFICE NOTE   NAJE, RICE                     MRN:          [id number]  DATE:06/13/2008                            DOB:          31-Dec-1929    Ms. Chern is pleasant female who has a history of coronary artery  disease status post bypassing graft in 1996.  She also has chronic chest  pain.  Her last cardiac catheterization was performed in August 2006,  and she had widely patent grafts at that time.  She also has mild aortic  stenosis by a previous echocardiogram in October 2008.  Since I last saw  her, she is having multiple complaints.  She is complaining of some  weakness.  She also is complaining of leg pain that has been a chronic  issue by her report and present constantly.  She also has some dyspnea  on exertion which is unchanged, but there is no orthopnea or PND.  She  continues to have occasional chest pain.  The pain is in the substernal  area.  It lasts for 2-3 seconds.  It resolves spontaneously.  It is not  associated with nausea, vomiting, shortness of breath, or diaphoresis.  It is not pleuritic or positional nor is it related to food.  It is not  clearly exertional.   Her medications include:  1. Aspirin 325 mg p.o. daily.  2. Pepcid 20 mg p.o. b.i.d.  3. Atenolol 100 mg p.o. daily.  4. Imdur 90 mg p.o. daily.  5. Clonazepam 1 mg daily nightly.  6. Insulin.  7. Allopurinol 300 mg p.o. daily.  8. Zolpidem 10 mg p.o. nightly.  9. Lexapro 20 mg p.o. daily.  10.Lyrica.  11.Zocor 80 mg p.o. daily.  12.Vitamin B6.  13.Altace 10 mg p.o. daily.  14.Plavix 75 mg p.o. daily.  15.Lasix 40 mg p.o. daily.  16.Potassium 20 mEq p.o. daily.  17.Colace p.o. daily.   Physical exam today shows a blood pressure of 130/60 and her pulse is  70.  Her HEENT is normal.  Her neck is supple.  Her chest clear.  Cardiovascular exam reveals a regular rate and rhythm.  There is a 2/6  systolic murmur at the left sternal border.  S2 is not diminished.  Abdominal exam shows no tenderness.  Extremities show trace edema.   Her electrocardiogram shows sinus rhythm at a rate of 70.  There are no  significant ST changes noted.   DIAGNOSES:  1. Coronary artery disease status post coronary bypassing graft - the      patient will continue on her aspirin, Plavix, statin, beta-blocker,      and ACE inhibitor.  2. Chest pain - her symptoms are chronic and atypical.  We will      schedule her to have a Myoview.  If this shows no ischemia, then we      will continue with medical therapy.  3. Hypertension - her blood pressure is adequately controlled on her      present medications.  Dr. Drue Second is following her potassium and  renal function.  4. Diabetes mellitus.  5. Hyperlipidemia - she will continue on her statin and Dr. Drue Second is      following her lipids and liver.  Our goal LDL should be less than      70 given her history of coronary disease.  6. Mild aortic stenosis - we will plan to repeat her echocardiogram.  7. Peripheral neuropathy.   I will see her back in 1 year.     Madolyn Frieze Jens Som, MD, [facility]  Electronically Signed    BSC/MedQ  DD: 06/13/2008  DT: 06/14/2008  Job #: [id number]   cc:   Dalbert Mayotte, M.D.

## 2010-09-12 NOTE — Cardiovascular Report (Signed)
[name], BAI              ACCOUNT NO.:  [id number]   MEDICAL RECORD NO.:  [id number]          PATIENT TYPE:  INP   LOCATION:  2923                         FACILITY:  [facility]   PHYSICIAN:  Salvadore Farber, M.D. LHCDATE OF BIRTH:  Sep 20, 1929   DATE OF PROCEDURE:  11/26/2004  DATE OF DISCHARGE:                              CARDIAC CATHETERIZATION   PROCEDURE:  1.  Left heart catheterization.  2.  Left ventriculography.  3.  Abdominal aortography.  4.  Bypass graft angiography.  5.  Coronary angiography.   INDICATION:  [name] is a 77 year old lady, status post coronary artery  bypass grafting in 1996.  She had a negative adenosine Cardiolite performed  in December 2005.  She now presents with several episodes of chest  discomfort occurring at rest.  She was admitted to the hospital.  Initial  systolic blood pressure was 166.  Blood pressures have been lower in  hospital.  She has not had recurrent pain.  She ruled out for myocardial  infarction by serial enzymes and electrocardiograms.  She was then referred  for diagnostic angiography to exclude myocardial ischemia as the etiology of  her chest discomfort.   PROCEDURAL TECHNIQUE:  Informed consent was obtained.  Under 1% lidocaine  local anesthetic, a 5-French sheath was placed in the right common femoral  artery using the modified Seldinger technique.  Diagnostic angiography was  performed using a JL4 catheter for the native left system, JR4 catheter for  the native right system and each of the bypass grafts.  Left heart  catheterization and ventriculography were then performed using a pigtail  catheter.  The patient tolerated the procedure well and was transferred to  the holding room in stable condition.  Sheaths will be removed there.   COMPLICATIONS:  None.   FINDINGS:  1.  LV:  117/1/13.  EF 65% without regional wall motion abnormality.  2.  No aortic stenosis or mitral regurgitation.  3.  Left main:   Angiographically normal.  4.  LAD:  Moderate-sized vessel giving rise to a single large diagonal.  The      mid LAD has 95% stenosis.  The diagonal is occluded at its origin.  The      saphenous vein graft to the diagonal is widely patent, though there is      approximately 50% to 60% stenosis just after the anastomosis.  The free      LIMA to the mid LAD is widely patent with excellent distal runoff.  5.  Ramus intermedius:  Moderate-sized branching vessel.  There are minor      luminal irregularities.  6.  Circumflex:  A moderate-sized vessel giving rise to 2 obtuse marginals.      The first marginal has a 99% stenosis proximally and the second marginal      has a 90% stenosis proximally.  There is a sequential saphenous vein      graft to each of these marginal which is widely patent with excellent      distal runoff.  7.  RCA:  Moderate-sized dominant vessel.  There is 90%  stenosis in the      proximal vessel.  The saphenous vein graft to the distal vessel is      widely patent with excellent distal runoff.   IMPRESSION AND PLAN:  The patient has widely patent bypass grafts.  She does  have significant small vessel disease, particularly involving septal  perforators.  It is certainly possible that her chest discomfort represents  angina  from these.  Since angina occurred in the setting of markedly elevated blood  pressure, we will increase her Norvasc from 2.5 mg to 5 mg per day.  Continue her other medications including beta blocker and long-acting  nitrate.  We will plan on discharging her home later today.       WED/MEDQ  D:  11/26/2004  T:  11/27/2004  Job:  [id number]   cc:   Dalbert Mayotte, M.D.  [facility]  [place], [place] [address]  Fax: [phone]   Rollene Rotunda, M.D.  [address]  [address]  [place]  [place] [address]

## 2010-09-12 NOTE — H&P (Signed)
NAMEMARCINE, Lucas NO.:  [id number]   MEDICAL RECORD NO.:  [id number]          PATIENT TYPE:  EMS   LOCATION:  MAJO                         FACILITY:  [facility]   PHYSICIAN:  Duke Salvia, M.D.  DATE OF BIRTH:  1930/04/24   DATE OF ADMISSION:  11/25/2004  DATE OF DISCHARGE:                                HISTORY & PHYSICAL   REASON FOR ADMISSION:  [name] is a 77 year old female with known  coronary artery disease status post five-vessel CABG in 1996, followed by  Dr. Rollene Rotunda, who now presents to the emergency room, from  [facility] via her daughter, for evaluation and management of a two-week  history of progressive angina pectoris.  The patient reports experiencing a  dull, mid sternal chest discomfort (8 to 10 out of 10) over these past two  weeks, requiring treatment with sublingual nitroglycerin on a daily basis.  She states that the episodes occur with or without activity and are  reminiscent of her pre-CABG symptoms.  Up until today, her chest discomfort  responded promptly to nitroglycerin.  Today, however, she has been having  recurrent episodes which are much more intense and more frequent.  She has  taken a total of three nitroglycerin tablets today but presents to the  emergency room complaining of recurrent pain on a scale of 8/10.  She  reports some mild associated dyspnea and nausea with her chest discomfort  but no diaphoresis and no radiation to the jaw or upper extremities.  Electrocardiogram, on admission, reveals normal sinus rhythm with  nonspecific ST abnormalities.   ALLERGIES:  1.  PENICILLIN.  2.  SULFA.  3.  CODEINE.   MEDICATIONS PRIOR TO ADMISSION:  1.  Atenolol 50 every day.  2.  Norvasc 2.5 every day.  3.  Glipizide 10 b.i.d.  4.  Altace 20 every day.  5.  Humulin N 49 units q.a.m., 33 units q.p.m.  6.  Ambien 10 q.h.s.  7.  Klonopin 1 mg q.h.s.  8.  Coated aspirin 325 every day.  9.  Neurontin twice daily  (unsure of dose).  10. Famotidine 20 b.i.d.  11. Imdur daily (not sure of dose) started approximately two weeks ago.   PAST MEDICAL HISTORY:  1.  Coronary artery disease.      1.  Five-vessel CABG in 1996:  LIMA - LAD; SVG - Diagnosis; SVG - OM-          2/OM-3;  SVG - RCA.      2.  Normal adenosine Myoview.  EF 65%, December 2005, secondary to chest          pain, pre-op shoulder surgery clearance.  2.  Diabetes mellitus, insulin-requiring.      1.  Initial diagnosis approximately 16 years ago.      2.  Associated peripheral neuropathy.  3.  Hypertension.  4.  History of hypercholesterolemia.      1.  Intolerant to Pravachol/Lipitor secondary to myalgia.  5.  Obesity.  6.  Status post cholecystectomy.  7.  Status post total hysterectomy.  8.  Status post right shoulder  surgery, January 2006.   SOCIAL HISTORY:  The patient lives in [place].  She is widowed and has  five children.  She used to work as a Comptroller.  She has never smoked tobacco  and denies alcohol use.   FAMILY HISTORY:  Mother deceased, age 68, presumably secondary to myocardial  infarction.  Father deceased, age 8, secondary to a train accident.  The  patient has a brother, age 13, status post CABG at age 63.  A sister died in  her early 29s, status post CABG at age 1.   REVIEW OF SYSTEMS:  Denies any prior history of myocardial infarction,  congestive heart failure, or stroke.  Reports recent exertional dyspnea but  denies any orthopnea, paroxysmal nocturnal dyspnea, or significant lower  extremity edema.  Has occasional heartburn but no known history of peptic  ulcer disease.  Denies any recent evidence of upper or lower GI bleeding.  Remaining systems negative.  Complaint of bilateral lower extremity  claudication (left greater than right).   PHYSICAL EXAMINATION:  VITAL SIGNS:  Blood pressure 166/76 on admission,  pulse 88, respirations 24, temperature 98.8, SaO2 95% on room air.  GENERAL:  A  77 year old female in no apparent distress.  HEENT:  Normocephalic, atraumatic.  NECK:  Reveals audible carotid pulses without bruits.  No JVD.  LUNGS:  Clear to auscultation all fields.  HEART:  Regular rate and rhythm (S1 S2).  No murmurs, rubs, or gallops.  ABDOMEN:  Protuberant.  Nontender.  Intact bowel sounds.  No bruits.  EXTREMITIES:  __________ femoral pulses soft bilateral bruits.  Intact  distal pulses with no significant pedal edema.  NEUROLOGIC:  No focal deficit.   ADMISSION CHEST X-RAY:  Pending.   ADMISSION ELECTROCARDIOGRAM:  Normal sinus rhythm at 84 BPM with a normal  axis; nonspecific ST abnormalities.   LABORATORY DATA:  WBC 14.3, hemoglobin 13.5, hematocrit 39, and platelets  272.  INR is 0.9.  Cardiac markers pending.   IMPRESSION:  1.  Crescendo angina pectoris.  2.  Coronary artery disease.      1.  Five-vessel coronary artery bypass graft in 1996.      2.  Normal adenosine Myoview; EF 65%, December 2005.  3.  Diabetes mellitus, insulin-requiring.  4.  Hypertension.  5.  Hyperlipidemia.      1.  Statin intolerant.  6.  Presumed peripheral vascular disease.      1.  Bilateral femoral bruits.   PLAN:  1.  The patient will be admitted to an intensive care unit for close      monitoring and management of symptoms suggestive of crescendo angina      pectoris.  2.  She will be continued on aspirin and started on IV nitroglycerin and      heparin in the emergency room.  3.  If cardiac markers are abnormal, recommendation would be to add a 2B3A      inhibitor as well.  4.  Atenolol will be increased to b.i.d. dosing, given her ongoing angina,      and elevated blood pressure, and current heart rate.  5.  We will also continue her Norvasc for added antianginal effect and      continue Altace as well.  6.  We will defer the initiation of a Statin at this time, pending review of     her lipid profile, but will need to consider alternatives given her       prior history of intolerance to both  Pravachol and Lipitor.  7.  In addition to cycling cardiac markers, we will check a fasting lipid      profile in the morning as well as TSH level.   RECOMMENDATIONS:  Proceed with a diagnostic coronary angiography in the  morning with possible percutaneous intervention.  Risks/benefits of the  procedure have been discussed.  The patient is agreeable with this plan.  Of  note, our recommendation would be to add a distal aortogram to exclude  significant peripheral vascular disease, given her findings of bilateral  femoral bruits and complaint of claudication.      Gene   GS/MEDQ  D:  11/25/2004  T:  11/25/2004  Job:  [id number]   cc:   Ria Clock, Dr.  Kathryne Sharper,

## 2010-09-12 NOTE — Procedures (Signed)
[place]. [facility]  Patient:    [name], [name]                     MRN: [id number] Proc. Date: 08/06/99 Adm. Date:  [id number] Attending:  Charna Elizabeth CC:         Dr. Elliot Cousin, Outpatient Cl                           Procedure Report  DATE OF BIRTH:  October 19, 1929.  REFERRING PHYSICIAN:  Dr. Elliot Cousin, [facility].  PROCEDURE PERFORMED:  Colonoscopy.  ENDOSCOPIST:  Anselmo Rod, M.D.  INSTRUMENT USED:  Olympus video colonoscope.  INDICATIONS FOR PROCEDURE:  Ongoing diarrhea with trace guaiac positive stools in a 77 year old white female, rule out masses, polyps, hemorrhoids, etc.   PREPROCEDURE PREPARATION:  Informed consent was procured from the patient. The patient was fasted for eight hours prior to the procedure and prepped with a bottle of magnesium citrate and a gallon of NuLytely the night prior to the procedure.  PREPROCEDURE PHYSICAL:  The patient had stable vital signs.  Neck supple. Chest clear to auscultation.  S1, S2 regular.  Abdomen soft with normal abdominal bowel sounds.  DESCRIPTION OF PROCEDURE:  The patient was placed in the left lateral decubitus position and sedated with 70 mg of Demerol and 7 mg of Versed intravenously.  Once the patient was adequately sedated and maintained on low-flow oxygen and continuous cardiac monitoring, the Olympus video colonoscope was advanced from the rectum to the cecum with slight difficulty secondary to the residual stool in the colon.  There was evidence of pandiverticular disease with inspissated stool in several of the diverticular pockets.  Small internal hemorrhoids were seen.  No masses or polyps were present.  The patient tolerated the procedure well without complications. Random biopsies were done from the colon to rule out collagenous colitis.  IMPRESSION: 1. Pandiverticulosis. 2. Small nonbleeding internal hemorrhoids. 3. No masses or polyps  seen.  Procedure completed up to the cecum. 4. Random biopsies done from the colon to rule out collagenous versus    microscopic colitis.  RECOMMENDATIONS: 1. Await pathology results. 2. Continue Lomotil for now. 3. Outpatient follow-up in the next two weeks. DD:  08/06/99 TD:  08/07/99 Job: [id number]

## 2010-09-12 NOTE — Assessment & Plan Note (Signed)
[facility]                              CARDIOLOGY OFFICE NOTE   [name], [name]                     MRN:          [id number]  DATE:12/09/2005                            DOB:          February 11, 1930    [name] is a pleasant female who has been followed previously by Dr.  Antoine Poche; however, she lives in [place] and would prefer to be seen  here.  She has a history of coronary artery disease, status post coronary  artery bypassing graft in 1996.  At that time, she had LIMA to the LAD,  saphenous vein graft to the diagonal, sequential saphenous vein graft to the  OM-2 and OM-3 and a saphenous vein graft to the distal right coronary  artery.  She has had problems with chest pain since her surgery.  Note, her  last catheterization was in August 2006 performed by Dr. Samule Ohm.  At that  time, she was found to have widely patent grafts.  There was small-vessel  disease and she has been treated medically.  She continues to have  occasional chest pain.  It begins in the left chest area and radiates  across.  It is more common in the evening.  It can occur at rest or with  exertion.  It lasts for seconds.  She also has some dyspnea on exertion but  there is no orthopnea or PND.  She has chronic pedal edema.   MEDICATIONS:  1. Glipizide 10 mg p.o. daily.  2. Insulin.  3. Aspirin 325 mg p.o. daily.  4. Pepcid 20 mg p.o. b.i.d.  5. Atenolol 75 mg p.o. daily.  6. Norvasc 5 mg p.o. daily.  7. Zetia 10 mg p.o. daily.  8. Imdur 90 mg p.o. daily.  9. Clonazepam 1 mg p.o. q.h.s.  10.Hydrochlorothiazide 25 mg p.o. daily.  11.Altace 10 mg p.o. daily.  12.Lyrica 150 mg p.o. b.i.d.  13.Naproxen 500 mg p.o. daily.  14.Insulin.   PHYSICAL EXAMINATION:  VITAL SIGNS:  Blood pressure 161/68, pulse 73, weight  206 pounds.  NECK:  Supple and I cannot appreciate bruits.  CHEST:  Clear.  CARDIOVASCULAR:  Regular rate and rhythm.  EXTREMITIES:  Trace to 1+  edema.   Her electrocardiogram shows a normal sinus rhythm at a rate of 73.  The axis  is normal.  There are no ST changes noted.   DIAGNOSES:  1. Coronary artery disease, status post coronary bypassing graft.  2. History of intermittent chest pain.  3. Diabetes mellitus.  4. Hypertension.  5. Hyperlipidemia.  6. History of peripheral neuropathy.   PLAN:  [name] is complaining of occasional chest pain that is not  clearly cardiac.  Her electrocardiogram shows no significant abnormalities.  We will plan to repeat her adenosine Myoview to risk stratify.  If it shows  normal perfusion, then we will continue with medical therapy.  Note, her LV  function has been preserved in the past.  She apparently has had myalgias  with Lipitor in the past as well as Pravachol.  She is presently on Zetia.  We will give her  Vytorin 10/40 mg p.o. q.h.s. to see if she will tolerate  this.  If so, we will check lipids and liver in 6 weeks.  If she does not  tolerate the Zocor component, then we will resume Zetia.  Her blood pressure  is elevated today and I have asked her to increase her atenolol to 100 mg  p.o. daily.  We will see her back in approximately 3 months or sooner if her  nuclear study is abnormal.  We will continue to titrate her medications for  her blood pressure if indicated and we could certainly increase her Norvasc  as needed.  Note, the patient does not smoke.  We discussed the importance  of exercise.                              Madolyn Frieze Jens Som, MD, [facility]    BSC/MedQ  DD:  12/09/2005  DT:  12/09/2005  Job #:  [id number]   cc:   Dalbert Mayotte, MD

## 2010-09-12 NOTE — Op Note (Signed)
[name], [name]              ACCOUNT NO.:  [id number]   MEDICAL RECORD NO.:  [id number]          PATIENT TYPE:  OIB   LOCATION:  2899                         FACILITY:  [facility]   PHYSICIAN:  Mark C. Ophelia Charter, M.D.    DATE OF BIRTH:  Feb 07, 1930   DATE OF PROCEDURE:  05/05/2004  DATE OF DISCHARGE:                                 OPERATIVE REPORT   PREOPERATIVE DIAGNOSIS:  Right shoulder rotator cuff tear.   POSTOPERATIVE DIAGNOSIS:  Right shoulder labral tear, chondromalacia,  glenohumeral joint and rotator cuff tear full thickness.   PROCEDURE:  Diagnostic and operative arthroscopy right shoulder labral  debridement and chondroplasty glenohumeral joint, open  rotator cuff repair.   SURGEON:  Mark C. Ophelia Charter, M.D.   ANESTHESIA:  General plus scalene block.   DESCRIPTION OF PROCEDURE:  After induction of general anesthesia,  orotracheal intubation with preoperative antibiotics, the patient was placed  in beach chair position and standard prepping and draping was performed  after a U drape was applied.  Impervious stockinette, Coban and split sheets  drapes were applied.  Arthroscope was introduced from posterior approach.  Anterior portal was made inferior to the biceps tendon with the Wissinger  rod.  Probing anteriorly and debridement anteriorly revealed that there was  a partial slap lesion with detachment of the superior labrum partially.  She  had some degeneration of the labrum at the 1 and 2 o'clock position with a  complex tear. This was trimmed back but she did not have a true Bankart  lesion.  There was some fraying of the labrum, some other layers were  trimmed.  Grade III chondromalacia was present.  This was trimmed with  shaver.  Posterior labrum was intact.  No Hill-Sachs lesion was present.  Undersurface of the rotator cuff was inspected.  A tear was present with  undersurface tearing.  After debridement of the undersurface of the cuff, it  was obvious that there was  full thickness tear.  Some debridement on the  glenohumeral joint grade III changes were present on the head.  This was  trimmed back and one small area of grade III changes on the glenoid which  was trimmed.  Arthroscope was then removed.  The shoulder was suctioned dry.  Small incision was made along the anterior aspect of the acromion.  Deltoid  was peeled off with Bovie electrocautery.  Tie was placed underneath.  The  three quarter straight osteotome was used to perform acromioplasty.  Undersurface was smooth with a fine file.  Subacromial bursectomy was  performed.  Rotator cuff tear was longitudinal split directly over the  lateral aspect of the biceps tendon.  Some sutures of #2 Ethibond were used  with an UltraFix anchor to pull the tear together and then some simple  sutures pulling side to side repairing the tear.  There was good bleeding  from the UltraFix anchor hole present and no other tears were present.  Some  adhesions anteriorly over __________  were broken up with fingertip.  Shoulder had full range of motion.  After irrigation, inspection for second  tear  was negative.  The deltoid was __________ back to the acromion through  holes of bone made with needle driver and needle.  Ethibond sutures were  used through deltoid, 2-  0 Vicryl subcutaneous tissues, skin staple closure.  Postoperative dressing.  Nylon in the anterior and posterior arthroscopic portals and a shoulder  immobilizer.  The patient tolerated the procedure well and was transferred  to the recovery room to be discharged home.       MCY/MEDQ  D:  05/05/2004  T:  05/05/2004  Job:  [id number]   cc:   Donald Pore, MD  Internal Medicine Resident - [address]  [place]  [place] [address]  Fax: [phone]

## 2010-09-12 NOTE — Assessment & Plan Note (Signed)
[facility]                            CARDIOLOGY OFFICE NOTE   Carolyn, Lucas                     MRN:          [id number]  DATE:03/24/2006                            DOB:          21-May-1929    [name] returns for followup today.  Please refer to my previous  notes for details.  Since I last saw her, she continues to have chest  pain, which is a chronic issue.  It is unchanged.  There is no  significant dyspnea on exertion, orthopnea or PND, but there is mild  pedal edema.  She also complains of numbness in her lower extremities  bilaterally and heaviness that is essentially there all the time.  There  is no pain.   MEDICATIONS:  1. Insulin.  2. Aspirin 325 mg p.o. daily.  3. Pepcid 20 mg p.o. b.i.d.  4. Atenolol 100 mg p.o. daily.  5. Norvasc 2.5 mg p.o. daily.  6. Imdur 90 mg p.o. daily.  7. Clonazepam 1 mg p.o. nightly.  8. Altace 10 mg p.o. daily.  9. Lyrica 150 mg p.o. b.i.d.  10.Vytorin 10/40 mg nightly.  11.Lasix 20 mg p.o. daily.  12.Potassium 20 mEq p.o. daily.   PHYSICAL EXAMINATION:  Shows a blood pressure of 151/72 and a pulse of  74.  She weighs 207 pounds.  NECK:  Supple and there are no bruits.  CHEST:  Clear.  CARDIOVASCULAR:  Exam reveals a regular rate and rhythm.  EXTREMITIES:  Show 1+ edema.   An electrocardiogram shows a sinus rhythm at a rate of 74.  The axis is  normal.  There are no significant ST changes noted.   DIAGNOSES:  1. Coronary artery disease, status post coronary artery bypassing      graft.  2. Chronic chest pain.  3. Diabetes mellitus.  4. Hypertension.  5. Hyperlipidemia.  6. Peripheral neuropathy.   PLAN:  [name] appears to be doing well from a symptomatic  standpoint.  She is having chest pain, but this has been a chronic issue  for her and unchanged.  Electrocardiogram is also unchanged, and she did  have a nuclear study on December 14, 2005, that showed no ischemia or  infarction, and an ejection fraction of 67%.  Also of note her cardiac  catheterization in 2006 showed patent grafts with small vessel disease.  I therefore think we should continue with medical therapy.  Her blood  pressure appears to be reasonably well controlled, and we will make no  changes.  We did recently add Vytorin, and she appears to be tolerating  this.  She had blood drawn by Dr. Drue Second recently, and we will have that  forwarded to [place] for our records.  Her goal LDL should be less than 70,  given her history of coronary disease.  We discussed the importance of  diet and exercise today.  Note she does not smoke.  I will see her back  in six months.     Carolyn Frieze Jens Som, MD, [facility]  Electronically Signed    BSC/MedQ  DD: 03/24/2006  DT: 03/24/2006  Job #: [id number]   cc:   Dalbert Mayotte, M.D.

## 2010-09-12 NOTE — Discharge Summary (Signed)
[name], [name] NO.:  [id number]   MEDICAL RECORD NO.:  [id number]          PATIENT TYPE:  INP   LOCATION:  2033                         FACILITY:  [facility]   PHYSICIAN:  Vida Roller, M.D.   DATE OF BIRTH:  28-Dec-1929   DATE OF ADMISSION:  11/25/2004  DATE OF DISCHARGE:  11/27/2004                                 DISCHARGE SUMMARY   PRINCIPAL DIAGNOSIS:  Chest pain/coronary artery disease.   OTHER DIAGNOSES:  1.  Hypertension.  2.  Hyperlipidemia.  3.  Type 2 diabetes mellitus.  4.  Peripheral neuropathy.  5.  Status post cholecystectomy.  6.  Obesity.  7.  History of hysterectomy.   ALLERGIES:  PENICILLIN, SULFA, CODEINE, STATIN (intolerance secondary to  myalgias).   PROCEDURE:  Left heart cardiac catheterization.   HISTORY OF PRESENT ILLNESS:  A 77 year old female with prior history of  coronary artery disease status post 5 vessel coronary artery bypass grafting  in 1996, who presented to the emergency department on November 25, 2004 with a  2 week history of progressive exertional angina requiring sublingual  nitroglycerin on a daily basis. On the day of admission, chest pain was more  intense and recurrent and therefore, she presented to the emergency  department where an ECG showed sinus rhythm without acute STT changes. She  was admitted for further evaluation.   HOSPITAL COURSE:  The patient ruled out for myocardial infarction and  underwent left heart cardiac catheterization on November 26, 2004 revealing a  patent left internal mammary artery to the left anterior descending, a  patent sequential graft to the obtuse marginal 2 and obtuse marginal 3, as  well as a widely patent graft to the patent ductus arteriosus. She does have  native 3 vessel coronary disease but it was felt that she was well re-  vascularized. Post catheterization, she was taken back to her room where she  complained of chest discomfort that was successfully treated with a  GI  cocktail. She has not had any recurrence of chest discomfort and is  ambulating in the hallways without difficulty. She is being managed  medically from a cardiac standpoint and is being discharged home today in  satisfactory condition.   DISCHARGE LABORATORY DATA:  Hemoglobin and hematocrit 13.5 and 39.3. White  blood cell count 14.3. Platelets 272,000. MCV 85.6. Sodium 135, potassium  4.1, chloride 99, CO2 23, BUN 15, creatinine 1.1, glucose 217. Total  bilirubin 0.6. Alkaline phosphatase 73, AST 32, ALT 23, albumin 3.9. CK 230.  MB 3.4. Troponin I 0.02. Total cholesterol 168. Triglycerides 143, HDL 46,  LDL 93. Calcium 9.7. TSH 3.315.   DISPOSITION:  The patient is being discharged home today in good condition.   FOLLOW UP:  The patient should see her primary care physician, Dr. Ilsa Iha in  1 to 2 weeks. She has followup appointment at [facility] with Dr.  Antoine Poche on December 09, 2004 at 12:45 p.m.   DISCHARGE MEDICATIONS:  1.  Atenolol 75 mg q.d.  2.  Norvasc 5 mg q.d.  3.  Glipizide 10 mg b.i.d.  4.  Altace 20 mg q.d.  5.  Humulin 49 units in the a.m. and 33 units in the p.m.  6.  Ambien 10 mg q.h.s.  7.  Klonopin 1 mg q.h.s.  8.  Aspirin 325 mg q.d.  9.  Neurontin 300 mg b.i.d.  10. Zetia 10 mg q.d.  11. Pepcid 20 mg b.i.d.   Outstanding lab studies and duration of discharge encounter - 45 minutes.       CRB/MEDQ  D:  11/27/2004  T:  11/27/2004  Job:  [id number]   cc:   Loran Senters, M.D.  [facility]   Rollene Rotunda, M.D.  [phone] [address]  [address]  [place]  [place] [address]

## 2010-09-12 NOTE — Discharge Summary (Signed)
[place]  Patient:    [name], [name]                     MRN: [id number] Adm. Date:  [id number] Disc. Date: [id number] Attending:  Edwyna Perfect Dictator:   Susie Cassette, M.D. CC:         [facility]             Gerrianne Scale, M.D.             Marisue Brooklyn, M.D.                           Discharge Summary  DISCHARGE DIAGNOSES:  1. Unstable angina.  2. Diabetes mellitus, type 2.  3. Hypertension.  4. Spasmodic colon.  5. Psychiatric disorder.  DISCHARGE MEDICATIONS:  1. NPH insulin 35 units in the morning, 25 units at bedtime.  2. Klonopin 1 mg q.h.s.  3. Neurontin 400 mg in the morning and at bedtime.  4. Levsin 0.125 mg b.i.d.  5. Protonix 40 mg q.d.  6. Risperdal 1 mg q.d.  7. Tums 500 mg q.d.  8. Enteric-coated aspirin 325 mg q.d.  9. Atenolol 50 mg q.d. 10. Altace 5 mg b.i.d. 11. Norvasc 2.5 mg q.d. 12. Glucotrol 10 mg q.a.m. 13. Timoptic eye drops b.i.d.  FOLLOWUP:  [name] was instructed to call the clinic at [phone] to make a followup appointment with me, Dr. Wilkie Aye, in the next week.  PROCEDURES:  Adenosine Cardiolite.  The Cardiolite study revealed a normal study without evidence of induced myocardial ischemia.   In addition, it was found that the left ventricular ejection fraction was 57%.  CONSULTATIONS:   Cardiology, Dr. Purcell Mouton.  BRIEF HISTORY AND PHYSICAL:  This is a 77 year old woman with a past medical history of coronary artery disease status post three-vessel CABG who presented to the clinic complaining of chest pain.  She stated that she had been having chest pain x 2 weeks, at the center of the chest, 7/10, radiating to the back. She denies nausea and vomiting, shortness of breath, but does report diaphoresis.  She reports this pain lasts 5 to 10 minutes and improved with sublingual nitroglycerin.  She reports it occurs with either rest or with ambulation and feels that it is a  pressure-like pain.  She denied headache, cough, shortness of breath, nausea and vomiting, change in stool pattern.  ALLERGIES:  PENICILLIN causes swelling.  SULFA causes her to "feel funny." CODEINE also causes her to "feel funny."  MEDICATIONS PRIOR TO ADMISSION:  1. Insulin NPH 35 units q.a.m., 25 units q.h.s.  2. Glucotrol 10 mg q.a.m.  3. Norvasc 2.5 mg q.d.  4. Enalapril 10 mg b.i.d.  5. Atenolol 50 mg q.d.  6. Enteric-coated aspirin 325 mg q.d.  7. Tums 500 mg q.d.  8. Neurontin 400 mg q.h.s. and q.a.m.  9. Risperdal 1 mg q.d. 10. Klonopin 1 mg q.h.s. 11. Protonix 40 mg q.d. 12. Levsin 0.125 mg b.i.d. 13. Timoptic 0.25 mg 1 b.i.d. 14. Darvocet p.r.n.  PAST MEDICAL HISTORY:  1. Coronary artery disease associated with CABG of four vessels in 1996     with a negative Cardiolite in June 1999 and negative Cardiolite July 2000     with an EF of 63% and no ischemia.  2. Diabetes mellitus x 8 years associated with neuropathy.  Her last A1C was     7.3 in  July 2001.  3. Hypertension.  4. Colonoscopy in April 2001 that revealed diverticulosis and hemorrhoids.  5. Status post total abdominal hysterectomy, appendectomy, and     cholecystectomy.  6. Delusional disorder with paranoid features.  7. Spasmodic colon.  SOCIAL HISTORY:  [name] lives in [place] alone and is widowed x 22 years.  She has five grown children who are involved in her life.  She denies tobacco or alcohol use and is disabled secondary to coronary artery disease. 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  PHYSICAL EXAMINATION ON ADMISSION:  Vital signs showed temperature 97.8, blood pressure 156/75, pulse 76, respirations 16.  In general, this is a pleasant, obese woman in no acute distress.  Neck shows no JVD.  Cardiovascular: Regular rate and rhythm with a 2/6 systolic ejection murmur and no S3. Respiratory is clear to auscultation bilaterally.  Abdomen was soft with  positive bowel sounds, obese, nontender throughout.  Extremities with no edema.  Rectal heme negative with external hemorrhoid.  LABORATORY DATA ON ADMISSION:  White blood cell count 12.8, hemoglobin 12.9, platelets 234.  Sodium 138, potassium 4.4, chloride 98, bicarb 31, BUN 14, creatinine 0.8, glucose 140, LFTs normal.  PT 14.3, PTT 30.  CK-MB 134, MB fraction 3.5, troponin I less than 0.03.  EKG on admission: No acute ST-T wave changes, normal axis, and normal sinus rhythm.  Chest x-ray on admission revealed cardiomegaly with vascular congestion>  HOSPITAL COURSE:  #1: [name] was initially admitted as a rule out MI.  She was ruled out with serial enzymes and serial EKGs.  Initially, EKG revealed no acute ST-T wave changes with a normal axis and a normal sinus rhythm.  Initial set of enzymes showed CK-MB 134, MB fraction 3.5 with a troponin I of less than 0.03.  Second set of enzymes showed CK 108, CK-MB 2.9, and troponin 0.03. Final set of enzymes showed CK 91, MB 2.6, and troponin less than 0.03.  EKG the following morning revealed T wave inversions in V1 and no other leads.  In addition, the T waves were less flattened the next day than they were in the initial EKG on admission.  [name] did rule out for MI by the serial enzymes and EKGs; however, because of her strong history of coronary artery disease, it was felt that ischemia was still a possibility.  Therefore, cardiology was consulted, and an adenosine Cardiolite was performed.  This revealed a normal study without evidence of pharmacologic-induced myocardial ischemia and a left ventricular ejection fraction of 57%.  In addition, [name] on admission was taking enalapril 10 mg b.i.d.; however, she felt that she had some subjective improvement from Altace 5 mg b.i.d., and, therefore, she was discharged on Altace and told not to continue enalapril.  #2 - HEART MURMUR:  On admission to the [facility], a 2/6 systolic  ejection murmur was noted by Dr. Julian Reil.  Therefore, [name] will need an echocardiogram as an outpatient, and this will be deferred for further evaluation to her primary care physician.  #3 - INCREASED WHITE BLOOD CELL COUNT:  [name] was initially noted on admission to have an increased white count at a level of 12.8 and then 13.4. However, a UA was within normal limits, and chest x-ray revealed no evidence of infiltrate.  In addition, [name] was asymptomatic, and no further workup was pursued.  DISCHARGE LABORATORY DATA:  White blood cell count 13.4, hemoglobin 11.7,  platelets 204.  Sodium 137, creatinine 4.0, chloride 103, bicarb 29, BUN 16, creatinine 0.8, glucose 235.  Cholesterol 182, triglycerides 199, HDL 38, LDL 106.  Negative UA.  DISPOSITION:  To home with family.  CONDITION:  Good.  PHYSICIANS:  Residents: Leory Plowman, M.D. and Susie Cassette, M.D. Attending: C. Ulyess Mort, M.D. DD:  11/19/99 TD:  11/22/99 Job: [id number]

## 2010-12-10 ENCOUNTER — Encounter: Payer: Self-pay | Admitting: Cardiology

## 2011-04-15 IMAGING — CR DG CHEST 1V PORT
1 series · 1 of 1 positions shown · non-contrast
Comparison: 03/19/2008, 11/25/2004

CLINICAL DATA: PICC line placement

PORTABLE CHEST - 1 VIEW

[view not recorded]
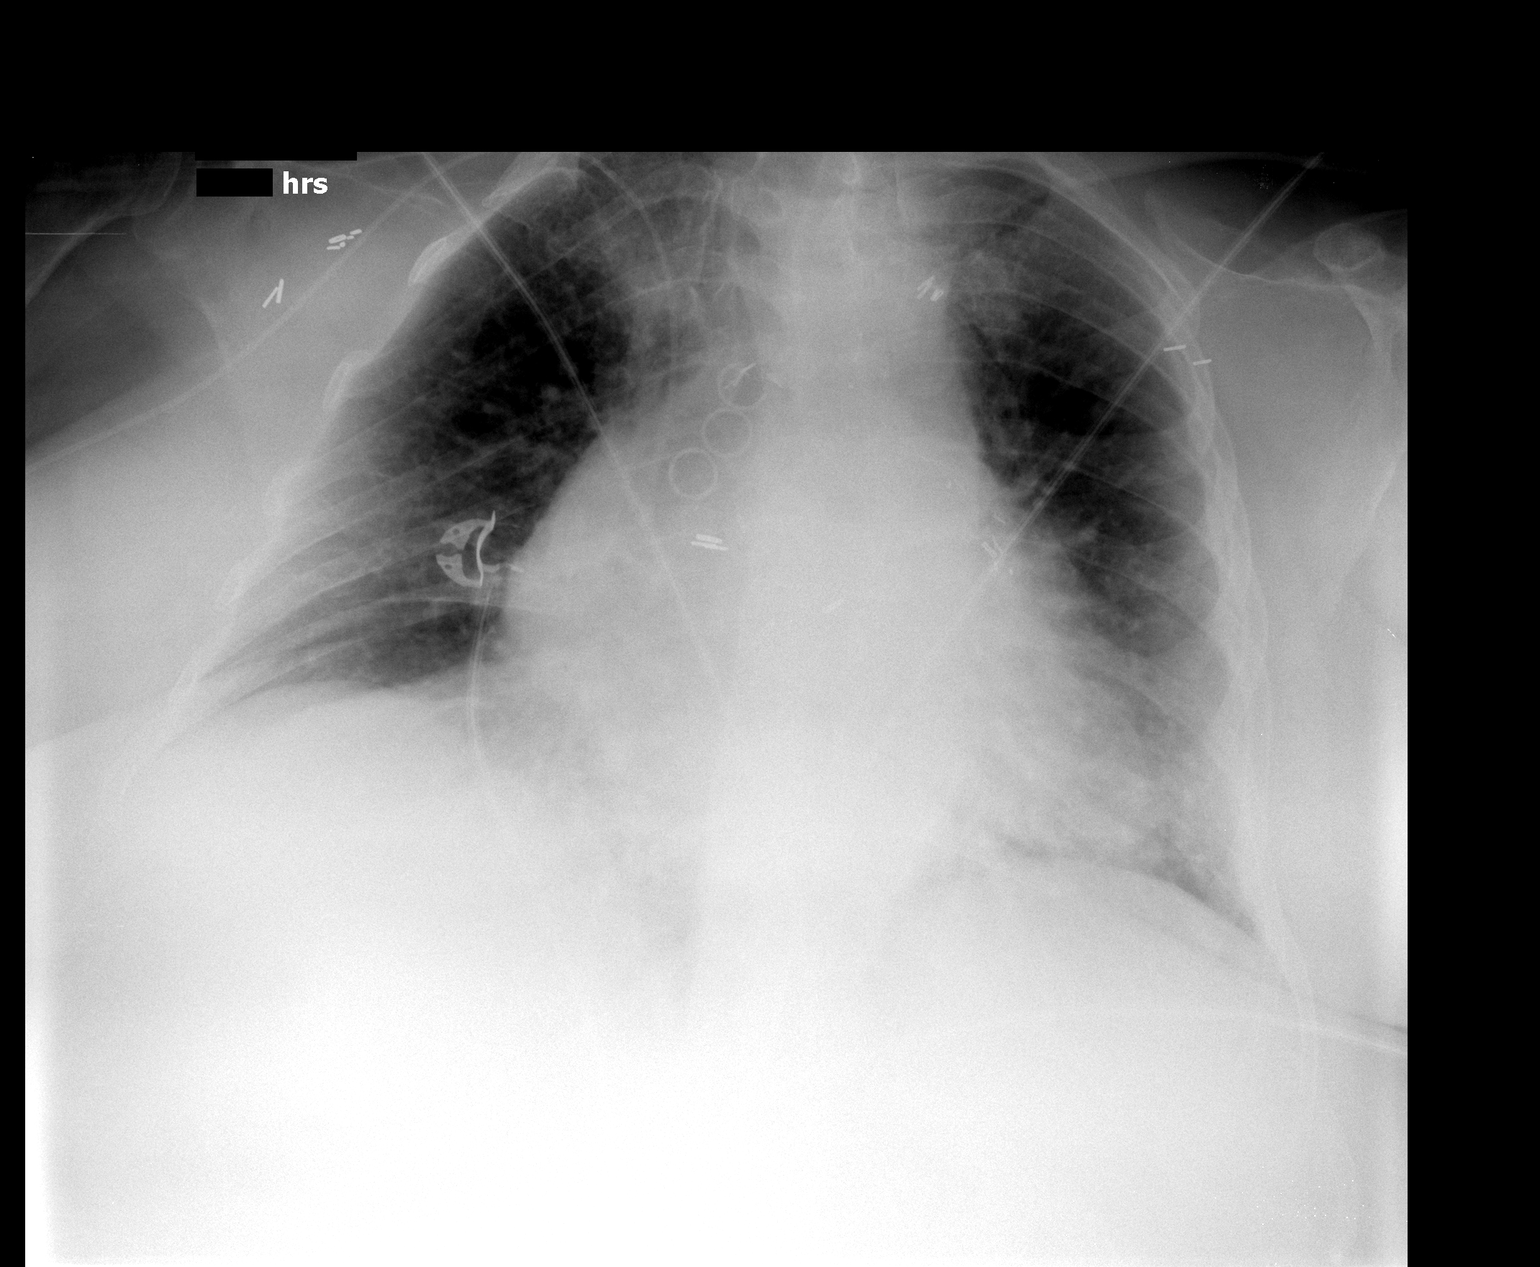

[1 of 1 positions shown; findings below may reference images not displayed]

FINDINGS: PICC line tip terminates at over the distal SVC.  No
pneumothorax.  The patient is status post CABG.  Enlargement of the
cardiomediastinal silhouette noted with vascular congestion.  No
pleural effusion. Stable right apical pleural thickening is noted.
IMPRESSION: Right-sided PICC line tip at the distal SVC.  No pneumothorax.

## 2011-04-17 ENCOUNTER — Other Ambulatory Visit: Payer: Self-pay | Admitting: Cardiology

## 2011-04-30 ENCOUNTER — Ambulatory Visit (INDEPENDENT_AMBULATORY_CARE_PROVIDER_SITE_OTHER): Payer: Medicare Other | Admitting: Family Medicine

## 2011-04-30 ENCOUNTER — Encounter: Payer: Self-pay | Admitting: Family Medicine

## 2011-04-30 VITALS — BP 112/68 | HR 72 | Temp 97.8°F | Ht 67.0 in | Wt 198.0 lb

## 2011-04-30 DIAGNOSIS — E790 Hyperuricemia without signs of inflammatory arthritis and tophaceous disease: Secondary | ICD-10-CM

## 2011-04-30 DIAGNOSIS — E119 Type 2 diabetes mellitus without complications: Secondary | ICD-10-CM

## 2011-04-30 DIAGNOSIS — E785 Hyperlipidemia, unspecified: Secondary | ICD-10-CM

## 2011-04-30 DIAGNOSIS — G5790 Unspecified mononeuropathy of unspecified lower limb: Secondary | ICD-10-CM

## 2011-04-30 DIAGNOSIS — R82998 Other abnormal findings in urine: Secondary | ICD-10-CM

## 2011-04-30 DIAGNOSIS — I359 Nonrheumatic aortic valve disorder, unspecified: Secondary | ICD-10-CM

## 2011-04-30 DIAGNOSIS — M792 Neuralgia and neuritis, unspecified: Secondary | ICD-10-CM

## 2011-04-30 NOTE — Progress Notes (Signed)
  Subjective:    Patient ID: [name], female    DOB: 31-Dec-1929, 78 y.o.   MRN: [id number]  HPI Paient ha had open heart surgery in 98. Patient with valvular heart DX. She believes it was the aortic valve that was diseased. She has neuropathy as well. Diabetes as well   Review of Systems  HENT: Positive for ear discharge.   All other systems reviewed and are negative.     BP 112/68  Pulse 72  Temp(Src) 97.8 F (36.6 C) (Oral)  Ht 5\' 7"  (1.702 m)  Wt 198 lb (89.812 kg)  BMI 31.01 kg/m2  SpO2 92% Objective:   Physical Exam Wheelchair bound WF w/health aide  lungs were clear cardiovascular she has a loud for 5 or 6 systolic murmur consistent with aortic stenosis examination of her lower extremities revealed marked diminished sensation with microfilament both the left and right foot with the left foot being worse than the right pulses were also diminished in both the PT and DP as well No signs of any infection or ulcerations of the foot noted Both TMs were predominantly clear and looked normal as there was just a little bit of wax in both ears     Assessment & Plan:   assessment and plan #1 aortic stenosis of course continue following up with her cardiologist  #2 diabetes we'll need to find out when her last A1c was done she is currently on Lantus and NovoLog flex pen #3 gout we'll need to check a uric acid if can't get lab work #4  #4 hyperlipidemia we'll need to check cholesterol if that has not been in recently  #5 reduced amount Debrox by just using it twice a week as a twice a day and that will hopefully help with the discharge she is noticing from both ears  Return in 2 months

## 2011-04-30 NOTE — Patient Instructions (Signed)

## 2011-05-12 ENCOUNTER — Encounter: Payer: Self-pay | Admitting: Family Medicine

## 2011-05-12 ENCOUNTER — Ambulatory Visit (INDEPENDENT_AMBULATORY_CARE_PROVIDER_SITE_OTHER): Payer: Medicare Other | Admitting: Family Medicine

## 2011-05-12 DIAGNOSIS — G589 Mononeuropathy, unspecified: Secondary | ICD-10-CM

## 2011-05-12 DIAGNOSIS — E349 Endocrine disorder, unspecified: Secondary | ICD-10-CM

## 2011-05-12 DIAGNOSIS — R29898 Other symptoms and signs involving the musculoskeletal system: Secondary | ICD-10-CM

## 2011-05-12 DIAGNOSIS — Z7409 Other reduced mobility: Secondary | ICD-10-CM | POA: Insufficient documentation

## 2011-05-12 NOTE — Patient Instructions (Signed)
Return Thursday for further evaluation.

## 2011-05-12 NOTE — Progress Notes (Signed)
Patient ID: [name], female   DOB: 14-Dec-1929, 78 y.o.   MRN: [id number]  The patient is here for evaluation of a motorized wheelchair.  Patient has neuropathy both of her feet and her hands. Due to the neuropathy she has difficulty and bleeding around her house tending to her daily needs and basically without some type of assistance would be limited to probably one place or chair in her house. She's had neuropathy for 9-10 years and was secondary to her diabetes.    She's found that using cane and walker because of her multiple neuropathy she has been poor balance and is concerned about falling whe she is by herself.  The patient is using a non-motorized wheelchair currently at this time. The problem with the nonmotorized wheelchair is that as neuropathy extends to her hands and arms she is having progressively difficulty turning  the wheels of the wheelchair to propel herself.   The patient appears alert enough and has proper mental abilities to operate a power wheelchair safely at home. Also see appears to have enough strength of her arms and hands to operate motorized wheelchair control.   Patient has weakness of her hands arms and feet in the feet she has loss of most of sensation in both lower extremities as well.  Patient's has diabetes with extensive neuropathy. She has anxiety disorder. It is significant or severe aortic stenosis as well. Initially for 10 years.  BP 125/71  Pulse 81  Ht 5\' 4"  (1.626 m)  Wt 198 lb (89.812 kg)  BMI 33.99 kg/m2  SpO2 92%  Her weight is 198 She has good motor strength in upper and lower extremities at this time she has marked decreased sensation in both of her lower feet..  She also has as some atrophy changes of her lower extremities and lower leg.  She's able to maintain an erect upright position in the chair. She is able to transfer slowly from her chair to the bed but has always utilize a wheelchair in my presence It does appear to me the  patient would benefit from having motorized wheelchair for her day-to-day activities.

## 2011-05-13 LAB — HEMOGLOBIN A1C
Hgb A1c MFr Bld: 8 % — ABNORMAL HIGH (ref <5.7)
Mean Plasma Glucose: 183 mg/dL — ABNORMAL HIGH (ref <117)

## 2011-05-13 LAB — CBC WITH DIFFERENTIAL/PLATELET
Basophils Absolute: 0 10*3/uL (ref 0.0–0.1)
Basophils Relative: 0 % (ref 0–1)
HCT: 39.9 % (ref 36.0–46.0)
Lymphocytes Relative: 25 % (ref 12–46)
MCHC: 32.1 g/dL (ref 30.0–36.0)
Monocytes Absolute: 0.4 10*3/uL (ref 0.1–1.0)
Neutro Abs: 6.9 10*3/uL (ref 1.7–7.7)
Neutrophils Relative %: 68 % (ref 43–77)
Platelets: 219 10*3/uL (ref 150–400)
RDW: 13.9 % (ref 11.5–15.5)
WBC: 10.2 10*3/uL (ref 4.0–10.5)

## 2011-05-13 LAB — COMPLETE METABOLIC PANEL WITH GFR
Alkaline Phosphatase: 86 U/L (ref 39–117)
CO2: 34 mEq/L — ABNORMAL HIGH (ref 19–32)
Creat: 1.53 mg/dL — ABNORMAL HIGH (ref 0.50–1.10)
GFR, Est African American: 37 mL/min — ABNORMAL LOW
GFR, Est Non African American: 32 mL/min — ABNORMAL LOW
Glucose, Bld: 174 mg/dL — ABNORMAL HIGH (ref 70–99)
Sodium: 138 mEq/L (ref 135–145)
Total Bilirubin: 0.6 mg/dL (ref 0.3–1.2)
Total Protein: 6.5 g/dL (ref 6.0–8.3)

## 2011-05-13 LAB — TSH: TSH: 2.596 u[IU]/mL (ref 0.350–4.500)

## 2011-05-13 LAB — LIPID PANEL
HDL: 47 mg/dL (ref >39)
Total CHOL/HDL Ratio: 4.3 Ratio
Triglycerides: 204 mg/dL — ABNORMAL HIGH (ref <150)

## 2011-05-14 ENCOUNTER — Ambulatory Visit: Payer: Medicare Other | Admitting: Family Medicine

## 2011-05-25 ENCOUNTER — Telehealth: Payer: Self-pay | Admitting: *Deleted

## 2011-05-25 NOTE — Telephone Encounter (Signed)
Pt would like to know how often and what time of day should she take St. John's Wart supplement?

## 2011-05-26 NOTE — Telephone Encounter (Signed)
Once its in her system I have no particular time. If she is taking all her medications in the AM then take that then to make life easier. If she is not taking any medication the night is fine that way she can be assured that in the AM when she awakes life is better for her.

## 2011-05-26 NOTE — Telephone Encounter (Signed)
How many do you want her to take? She states the bottle says take 1-3 a day.

## 2011-05-26 NOTE — Telephone Encounter (Signed)
Informed pt's aide.

## 2011-05-26 NOTE — Telephone Encounter (Signed)
I am going to back off on making any other recommendations at this time. This is a supplement that has wide dosing and efficacy do to different levels of. purity. There is only one type of St. John's wort that I feel comfortable in dosing recommending at this time. So she really has to make that decision on how to take her current supplement.

## 2011-05-28 ENCOUNTER — Ambulatory Visit (INDEPENDENT_AMBULATORY_CARE_PROVIDER_SITE_OTHER): Payer: Medicare Other | Admitting: Family Medicine

## 2011-05-28 ENCOUNTER — Encounter: Payer: Self-pay | Admitting: Family Medicine

## 2011-05-28 DIAGNOSIS — E1129 Type 2 diabetes mellitus with other diabetic kidney complication: Secondary | ICD-10-CM

## 2011-05-28 DIAGNOSIS — E1159 Type 2 diabetes mellitus with other circulatory complications: Secondary | ICD-10-CM

## 2011-05-28 DIAGNOSIS — E1140 Type 2 diabetes mellitus with diabetic neuropathy, unspecified: Secondary | ICD-10-CM

## 2011-05-28 DIAGNOSIS — E785 Hyperlipidemia, unspecified: Secondary | ICD-10-CM

## 2011-05-28 DIAGNOSIS — E1151 Type 2 diabetes mellitus with diabetic peripheral angiopathy without gangrene: Secondary | ICD-10-CM

## 2011-05-28 DIAGNOSIS — E1121 Type 2 diabetes mellitus with diabetic nephropathy: Secondary | ICD-10-CM

## 2011-05-28 DIAGNOSIS — E1149 Type 2 diabetes mellitus with other diabetic neurological complication: Secondary | ICD-10-CM

## 2011-05-28 DIAGNOSIS — E1142 Type 2 diabetes mellitus with diabetic polyneuropathy: Secondary | ICD-10-CM

## 2011-05-28 DIAGNOSIS — I798 Other disorders of arteries, arterioles and capillaries in diseases classified elsewhere: Secondary | ICD-10-CM

## 2011-05-28 MED ORDER — EZETIMIBE 10 MG PO TABS: 10.0000 mg | ORAL_TABLET | Freq: Every day | ORAL | Status: DC

## 2011-05-28 MED ORDER — CARBAMIDE PEROXIDE 6.5 % OT SOLN: 5.0000 [drp] | OTIC | Status: DC

## 2011-05-28 NOTE — Patient Instructions (Addendum)
Cerumen Plug A cerumen plug is having too much wax in your ear canal. The outer ear canal is lined with hairs and glands that secrete wax. This wax is called cerumen. This protects the ear canal. It also helps prevent material from entering the ear. Too much wax can cause a feeling of fullness in the ears, decreased hearing, ringing in the ears, or an earache. Sometimes your caregiver will remove a cerumen plug with an instrument called a curette. Or he/she may flush the ear canal with warm water from a syringe to remove the wax. You may simply be sent home to follow the home care instructions below for wax removal. Generally ear wax does not have to be removed unless it is causing a problem such as one of those listed above. When too much wax is causing a problem, the following are a few home remedies which can be used to help this problem. HOME CARE INSTRUCTIONS  Put a couple drops of glycerin, baby oil, or mineral oil in the ear a couple times of day. Do this every day for several days. After putting the drops in, you will need to lay with the affected ear pointing up for a couple minutes. This allows the drops to remain in the canal and run down to the area of wax blockage. This will soften the wax plug. It may also make your hearing worse as the wax softens and blocks the canal even more.  After a couple days, you may gently flush the ear canal with warm water from a syringe. Do this by pulling your ear up and back with your head tilted slightly forward and towards a pan to catch the water. This is most easily done with a helper. You can also accomplish the same thing by letting the shower beat into your ear canal to wash the wax out. Sometimes this will not be immediately successful. You will have to return to the first step of using the oil to further soften the wax. Then resume washing the ear canal out with a syringe or shower.  Following removal of the wax, put ten to twenty drops of rubbing alcohol  into the outer ears. This will dry the canal and prevent an infection.  Do not irrigate or wash out your ears if you have had a perforated ear drum or mastoid surgery.  SEEK IMMEDIATE MEDICAL CARE IF:  You are unsuccessful with the above instructions for home care.  You develop ear pain or drainage from the ear.  MAKE SURE YOU:  Understand these instructions.  Will watch your condition.  Will get help right away if you are not doing well or get worse.  Document Released: 01/06/2001 Document Revised: 12/24/2010 Document Reviewed: 04/04/2008 [facility] Patient Information 2012 [place], [place].Diabetes and Your Kidneys The function of normal kidneys is to filter and clean blood. Kidneys also rid the body's waste products and extra fluid. When the kidneys are healthy, blood is brought into the kidney and filtered, keeping proteins and needed chemicals in the blood stream. Waste and extra fluid is removed, and the cleaned blood is returned to the blood stream. Kidney disease (nephropathy) develops slowly and may be caused by years of high blood glucose (sugar) and high blood pressure (hypertension) that damage the kidney's filters. When the kidneys are damaged, protein leaks into the urine. Damaged kidneys are not able to clean out waste and extra fluid. Waste and fluid build up in your blood instead of leaving the body in  the urine. As damage progresses, more and more protein leaks from the kidneys and waste products build up in the blood. The damage progresses until the kidneys fail.  CAUSES   Hypertension.   High blood glucose.   Family history.   Aging.   Obstruction problems.   Some drugs or medicines.  SYMPTOMS  Symptoms will not be seen or felt for many years. You will not feel sick when your kidneys do only half the job of healthy kidneys. You might not notice any signs of kidney failure until your kidneys have lost much of their ability to function. An early sign of damage is when small  amounts of protein (albumin) leak into the urine. However, the only way to know about the leakage is to have your urine tested. Without physical symptoms, a urine test is often not performed.  SCREENING  Annual urine tests should be done to screen for trace amounts of protein in the urine (microalbuminuria).   A periodic, 24 hour collection of urine to measure creatinine and protein should be done as an estimate of kidney function.   Having a bloodtest called, serum creatinine, performed once a year to estimate how your kidneys are filtering.  Some of these tests would include:  An ultrasound of your kidney.   Obtaining a small piece of the kidney to look at under a microscope (kidney biopsy).  PREVENTION  Controlling high blood pressure (hypertension) and high blood glucose (hyperglycemia) is critical. A blood pressure goal to maintain is below 130/80. Get early treatment for urinary tract infections and follow up regularly with your caregiver. If your disease progresses to end stage kidney failure, you will need dialysis or a transplant. Dialysis can be done in 2 ways:   Hemodialysis. Your blood flows from a tube in your arm through a machine. The machine filters waste and extra fluid. The cleaned blood flows back into your arm.   Peritoneal dialysis. Your belly is filled with a special fluid. The fluid collects waste products and extra fluid from your blood. The fluid is then drained from your belly and discarded.  SEEK MEDICAL CARE IF:   You are having problems keeping your blood glucose in the goal range.   You have swelling of the hands or feet.   You develop weakness.   You have muscles spasms.  Document Released: 05/03/2007 Document Revised: 10/27/2010 Document Reviewed: 09/20/2008 [facility] Patient Information 2012 [email or web address]

## 2011-05-28 NOTE — Progress Notes (Signed)
  Subjective:    Patient ID: [name], female    DOB: Dec 02, 1929, 78 y.o.   MRN: [id number]  HPI Patient is here for evaluation for diabetic neuropathy footwear. Patient is wheelchair-bound most the day but requires standing for short periods of time to utilize the bathroom in getting in and out of her bed. They also request a handicap sticker and will sign when they return from [facility] with the right for Review of Systems Please see other notes   BP 116/71  Pulse 84  Wt 204 lb (92.534 kg)  SpO2 91% Objective:   Physical Exam Extensive time was spent evaluating patient's feet which pulse were very faint while there was no signs of ischemia of the feet it was obvious that her circulation of the lower extremities were poor. She had multiple callus formation of the feet and essentially no sensation to microfilament palpation and examination       Assessment & Plan:  Extensive examination and time spent to evaluate patient for diabetic shoes for her diabetic neuropathy Will await handicap form to sign

## 2011-05-28 NOTE — Progress Notes (Signed)
Even though the patient had an extensive visit for evaluation diabetic shoes in good conscience I cannot let the patient leave without discussing disturbing laboratory findings the she's had since her last visit. First patient has shown a marked decline in kidney function. Her kidney function has gone from .83-1.53. Apparently several years ago her creatinine was 1.59 and that they have been for some other insult to her kidney was apparently not permanent with improvement of her kidneys function later. Explained to her that the decrease GFR shows a kidney functioning at about half of what it should be and at her age the creatinine is probably in the 2+ range and that when he gets to about +4 he did talk about dialysis. We can make some major changes to her diabetic treatment regimen but did not see improvement in the next 3-6 months she'll probably need to see a nephrologist. Also her A1c was at 8 indicating very poor diabetic control. In talking with her and her home aide they state that when they check her blood sugars it has been low and because it has been low the pharmacist recommended to prevent hypoglycemia not to take any short acting insulin with her meals. In further investigation very are checking her blood sugar just before she eats. As explained to both of them that her blood sugars should be checked between 30 and 45 minutes after eating and that is the post prandial blood sugar that is going to dictate her diabetic sliding scale. What they have been checking his her preprandial blood sugar and that is going to often below before she eats. We'll repeat the A1c in 3 months from the last one return at next scheduled appointment.   As I was leaving I was requested to please look exam in her left ear that has been causing her ongoing discomfort due to chronic drainage from the left ear. Both TMs are clear however the left ear did have some irritation around and wax present as well. Explained to them  that the wax is probably building up in the left ear since she states on her right causing wax build up in her left ear. When the wax he does dissolve is enough probably to irritate the left ear. We'll have him use Debrox ear wax softener 5 drops 3 times week hopefully to prevent buildup of wax in further irritation.

## 2011-05-29 ENCOUNTER — Telehealth: Payer: Self-pay | Admitting: *Deleted

## 2011-05-29 NOTE — Telephone Encounter (Signed)
Pt would like to have albuterol solution called in to the pharmacy but there is none listed on meds. Please advise.

## 2011-05-29 NOTE — Telephone Encounter (Signed)
[facility] her pharm send over refill request. Since we don't know sig, quantity, etc.

## 2011-05-29 NOTE — Telephone Encounter (Signed)
Pt.notified

## 2011-06-01 ENCOUNTER — Ambulatory Visit: Payer: Medicare Other | Admitting: Physician Assistant

## 2011-06-01 ENCOUNTER — Telehealth: Payer: Self-pay | Admitting: *Deleted

## 2011-06-01 NOTE — Telephone Encounter (Signed)
Do we have a discharge summary from the nursing home? I guess my concern is to put her on nebs that might have been only a short term thing. I see nothing on her problem list that would indicate a need for nebs or can wait until Dr. Thurmond Butts comes in tomorrow and see if he know.

## 2011-06-01 NOTE — Telephone Encounter (Signed)
Yes I will address with patient.

## 2011-06-01 NOTE — Telephone Encounter (Signed)
[name] this pt is coming in to see you at 1:30 today. Can you address this with her at that time. There is no discharge summary from the nursing home that I can see under the media tab

## 2011-06-01 NOTE — Telephone Encounter (Signed)
Aid for this patient called back and states that the pharmacy does not have the sig and quantity for neb solution because the nursing home always dispensed this to her. Please advise

## 2011-06-03 ENCOUNTER — Other Ambulatory Visit: Payer: Self-pay | Admitting: *Deleted

## 2011-06-05 ENCOUNTER — Other Ambulatory Visit: Payer: Self-pay | Admitting: *Deleted

## 2011-06-05 MED ORDER — CLONAZEPAM 1 MG PO TABS: 0.5000 mg | ORAL_TABLET | Freq: Every day | ORAL | Status: DC

## 2011-06-05 MED ORDER — PREGABALIN 150 MG PO CAPS: 150.0000 mg | ORAL_CAPSULE | Freq: Three times a day (TID) | ORAL | Status: DC

## 2011-06-23 NOTE — Progress Notes (Signed)
To Whom It May Concern Ms. Brunetti was seen on 05/28/2011.  She has more limitation of mobility. While she is able to go from a chair to bed or to count she is unable to walk any significant distance due to marked neuropathy in her lower extremities from diabetes.  A regular wheelchair would not enable her the mobility that she needs due to weakness of her hands and neuropathy of her upper extremity. Patient has severe upper and lower neuropathy.  I have only taking care of this patient for less than a year but I understand it her neuropathy has been progressive over the last 10 years.  At this current time with her diabetes both fasting and A1c being elevated despite high doses of insulin her prognosis is poor. Walker's have been tried in the past but she's currently wheelchair-bound and as mentioned is not satisfactory and providing her mobility she needs.  BP 116/71  Pulse 84  Wt 204 lb (92.534 kg)  SpO2 91% Patient had markedly severe neuropathy in both her upper and lower extremities  She appears to have good mobility in both her neck legs trunk and pelvis.  I have discussed patient that she will have enough room in her indwelling place to get between her bed chair and other is stationary household furniture and she is currently able to transfer at this time.  Once again a mobile wheelchair should help with her day-to-day independence and mobility. This note was based on the visit 05/28/2011 and dictated on 06/23/2011.

## 2011-06-30 ENCOUNTER — Other Ambulatory Visit: Payer: Self-pay | Admitting: Family Medicine

## 2011-07-07 ENCOUNTER — Ambulatory Visit (INDEPENDENT_AMBULATORY_CARE_PROVIDER_SITE_OTHER): Payer: Medicare Other | Admitting: Family Medicine

## 2011-07-07 ENCOUNTER — Encounter: Payer: Self-pay | Admitting: Family Medicine

## 2011-07-07 VITALS — BP 109/65 | HR 94 | Temp 98.2°F | Ht 62.0 in | Wt 202.0 lb

## 2011-07-07 DIAGNOSIS — L219 Seborrheic dermatitis, unspecified: Secondary | ICD-10-CM

## 2011-07-07 DIAGNOSIS — E1140 Type 2 diabetes mellitus with diabetic neuropathy, unspecified: Secondary | ICD-10-CM

## 2011-07-07 DIAGNOSIS — E1149 Type 2 diabetes mellitus with other diabetic neurological complication: Secondary | ICD-10-CM

## 2011-07-07 DIAGNOSIS — E1142 Type 2 diabetes mellitus with diabetic polyneuropathy: Secondary | ICD-10-CM

## 2011-07-07 DIAGNOSIS — Z23 Encounter for immunization: Secondary | ICD-10-CM

## 2011-07-07 DIAGNOSIS — L210 Seborrhea capitis: Secondary | ICD-10-CM

## 2011-07-07 MED ORDER — COAL TAR EXTRACT 2.8 % EX SHAM: 1.0000 "application " | MEDICATED_SHAMPOO | CUTANEOUS | Status: DC

## 2011-07-07 MED ORDER — KETOCONAZOLE 2 % EX SHAM: MEDICATED_SHAMPOO | CUTANEOUS | Status: DC

## 2011-07-07 NOTE — Progress Notes (Signed)
  Subjective:    Patient ID: [name], female    DOB: [birth year], 78 y.o.   MRN: [id number]  HPI #1 Scalp and ear irritation. Patient asked me to recheck her ears when I first walked into the room. Recheck of the ears were negative see below but she keeps his verbalizing his irritation of the ears. When she says wax from ears it turns out that is not this copious amount of wax going from her ear but his a waxy substance she feels that are on her ears. When further questioned she also feels as waxy substance on her scalp and head as well. #2 follow up w/ diabetes with neuropathy. We discussed with the patient and her aide about her A1c been poorly controlled. There was close to check her blood sugars after she ate and then determined amount of NovoLog to give her instead because her blood sugars postprandial was so bad he went back to checking her blood sugars before she ate. She's taking 10 units of NovoLog before she eats and I believe 15 units of Lantus every night. Stressed to them that they are missing the point of checking her blood sugar after she eats which is to correct the blood sugar spikes she experiences after eating.  #3 immunization update. 1 health maintenance item her initial colonoscopy. She did see a gastroenterologist because of her difficulty with ambulation opted not to do the colonoscopy due to her concern of her instability at home now 44 a lot of gas in August would be a concern about doing colonoscopy due to lack of elasticity of her colon at her age. She does need update on tetanus and Pneumonia vaccination.    Review of Systems  Constitutional: Negative for activity change, appetite change and fatigue.  Skin:       puritis  All other systems reviewed and are negative.  BP 109/65  Pulse 94  Temp(Src) 98.2 F (36.8 C) (Oral)  Ht 5\' 2"  (1.575 m)  Wt 202 lb (91.627 kg)  BMI 36.95 kg/m2  SpO2 94%     Objective:   Physical Exam  Constitutional: She is  oriented to person, place, and time. She appears well-developed and well-nourished.  HENT:  Head: Normocephalic.  Right Ear: Hearing, tympanic membrane and ear canal normal.  Left Ear: Hearing, tympanic membrane and ear canal normal.  Neurological: She is alert and oriented to person, place, and time.  Skin: Skin is warm and dry. Erythema: seborrhea present in the scalp.       Seborrhea present in the scalp  Psychiatric: She has a normal mood and affect.          Assessment & Plan:  #1 Seborrhea Dermatitis. Nizoral shampoo and T gel shampoo. #2 diabetes with neuropathy. I think they finally understand why we are checking postprandial blood sugars. We will go ahead and increase her NovoLog to 15 units before meals check her postprandial one hour later and give more NovoLog if needed. If her blood sugars remain greater than 150 we'll need to keep increasing her NovoLog before meals slowly. Would not change her basal insulin since before meals her blood sugars are usually under 150. #3 immunization needs and update. Will give DTaP and pneumonia vaccination today.

## 2011-07-07 NOTE — Patient Instructions (Signed)
Seborrheic Dermatitis Seborrheic dermatitis involves pink or red skin with greasy, flaky scales. This is often found on the scalp, eyebrows, nose, bearded area, and on or behind the ears. It can also occur on the central chest. It often occurs where there are more oil (sebaceous) glands. This condition is also known as dandruff. When this condition affects a baby's scalp, it is called cradle cap. It may come and go for no known reason. It can occur at any time of life from infancy to old age. CAUSES  The cause is unknown. It is not the result of too little moisture or too much oil. In some people, seborrheic dermatitis flare-ups seem to be triggered by stress. It also commonly occurs in people with certain diseases such as Parkinson's disease or HIV/AIDS. SYMPTOMS   Thick scales on the scalp.   Redness on the face or in the armpits.   The skin may seem oily or dry, but moisturizers do not help.   In infants, seborrheic dermatitis appears as scaly redness that does not seem to bother the baby. In some babies, it affects only the scalp. In others, it also affects the neck creases, armpits, groin, or behind the ears.   In adults and adolescents, seborrheic dermatitis may affect only the scalp. It may look patchy or spread out, with areas of redness and flaking. Other areas commonly affected include:   Eyebrows.   Eyelids.   Forehead.   Skin behind the ears.   Outer ears.   Chest.   Armpits.   Nose creases.   Skin creases under the breasts.   Skin between the buttocks.   Groin.   Some adults and adolescents feel itching or burning in the affected areas.  DIAGNOSIS  Your caregiver can usually tell what the problem is by doing a physical exam. TREATMENT   Cortisone (steroid) ointments, creams, and lotions can help decrease inflammation.   Babies can be treated with baby oil to soften the scales, then they may be washed with baby shampoo. If this does not help, medicated  shampoos should work.   Adults can also use medicated shampoos.   Your caregiver may prescribe corticosteroid cream and shampoo containing an antifungal or yeast medicine (ketoconazole). Hydrocortisone or anti-yeast cream can be rubbed directly onto seborrheic dermatitis patches. Yeast does not cause seborrheic dermatitis, but it seems to add to the problem.  In infants, seborrheic dermatitis is often worst during the first year of life. It tends to disappear on its own as the child grows. However, it may return during the teenage years. In adults and adolescents, seborrheic dermatitis tends to be a long-lasting condition that comes and goes over many years. HOME CARE INSTRUCTIONS   Use prescribed medicines as directed.   In infants, do not aggressively remove the scales or flakes on the scalp with a comb or by other means. This may lead to hair loss.  SEEK MEDICAL CARE IF:   The problem does not improve from the medicated shampoos, lotions, or other medicines given by your caregiver.   You have any other questions or concerns.  Document Released: 04/13/2005 Document Revised: 04/02/2011 Document Reviewed: 09/02/2009 [employer] Patient Information 2012 [employer]. 

## 2011-07-09 ENCOUNTER — Other Ambulatory Visit: Payer: Self-pay | Admitting: Family Medicine

## 2011-07-10 ENCOUNTER — Other Ambulatory Visit: Payer: Self-pay | Admitting: *Deleted

## 2011-07-23 ENCOUNTER — Telehealth: Payer: Self-pay | Admitting: *Deleted

## 2011-07-23 ENCOUNTER — Other Ambulatory Visit: Payer: Self-pay | Admitting: Family Medicine

## 2011-07-23 NOTE — Telephone Encounter (Signed)
Pt states she is itching all aver her body and her head. Informed pt to get benadryl OTC for the itching. Pt states she wants something to put on her head for the itching. Please advise.

## 2011-07-23 NOTE — Telephone Encounter (Signed)
She can use the nizoral shampoo more frequent for a while from 3 x a week to 4 for a week or two., and I will be glad to get her a dermatologist referral.

## 2011-07-23 NOTE — Telephone Encounter (Signed)
Pt notified. KJ LPN 

## 2011-08-03 ENCOUNTER — Telehealth: Payer: Self-pay | Admitting: *Deleted

## 2011-08-03 NOTE — Telephone Encounter (Signed)
P/t's daughter informed

## 2011-08-03 NOTE — Telephone Encounter (Signed)
Daughter states her mom is in [facility] and she is wanting [place] to fill out FMLA papers for daughter. Daughter is not a pt with [place]. Please advise if we can fill out papers.

## 2011-08-03 NOTE — Telephone Encounter (Signed)
If patient is actually admitted will need to docs there to fill out the paperwork for her.

## 2011-09-08 ENCOUNTER — Ambulatory Visit: Payer: Medicare Other | Admitting: Family Medicine

## 2012-07-14 ENCOUNTER — Encounter: Payer: Medicare Other | Admitting: Vascular Surgery

## 2012-07-14 ENCOUNTER — Emergency Department (HOSPITAL_COMMUNITY): Payer: Medicare Other

## 2012-07-14 ENCOUNTER — Other Ambulatory Visit: Payer: Self-pay

## 2012-07-14 ENCOUNTER — Emergency Department (HOSPITAL_COMMUNITY)
Admission: EM | Admit: 2012-07-14 | Discharge: 2012-07-14 | Disposition: A | Discharge status: Home / Self Care | Payer: Medicare Other | Attending: Emergency Medicine | Admitting: Emergency Medicine

## 2012-07-14 ENCOUNTER — Encounter (HOSPITAL_COMMUNITY): Payer: Self-pay | Admitting: Emergency Medicine

## 2012-07-14 DIAGNOSIS — Z79899 Other long term (current) drug therapy: Secondary | ICD-10-CM | POA: Insufficient documentation

## 2012-07-14 DIAGNOSIS — M7989 Other specified soft tissue disorders: Secondary | ICD-10-CM | POA: Insufficient documentation

## 2012-07-14 DIAGNOSIS — Z8679 Personal history of other diseases of the circulatory system: Secondary | ICD-10-CM | POA: Insufficient documentation

## 2012-07-14 DIAGNOSIS — L98499 Non-pressure chronic ulcer of skin of other sites with unspecified severity: Secondary | ICD-10-CM

## 2012-07-14 DIAGNOSIS — Z792 Long term (current) use of antibiotics: Secondary | ICD-10-CM | POA: Insufficient documentation

## 2012-07-14 DIAGNOSIS — I10 Essential (primary) hypertension: Secondary | ICD-10-CM | POA: Insufficient documentation

## 2012-07-14 DIAGNOSIS — L97509 Non-pressure chronic ulcer of other part of unspecified foot with unspecified severity: Secondary | ICD-10-CM | POA: Insufficient documentation

## 2012-07-14 DIAGNOSIS — R230 Cyanosis: Secondary | ICD-10-CM

## 2012-07-14 DIAGNOSIS — E1169 Type 2 diabetes mellitus with other specified complication: Secondary | ICD-10-CM | POA: Insufficient documentation

## 2012-07-14 DIAGNOSIS — Z8669 Personal history of other diseases of the nervous system and sense organs: Secondary | ICD-10-CM | POA: Insufficient documentation

## 2012-07-14 DIAGNOSIS — Z794 Long term (current) use of insulin: Secondary | ICD-10-CM | POA: Insufficient documentation

## 2012-07-14 DIAGNOSIS — Z862 Personal history of diseases of the blood and blood-forming organs and certain disorders involving the immune mechanism: Secondary | ICD-10-CM | POA: Insufficient documentation

## 2012-07-14 DIAGNOSIS — E669 Obesity, unspecified: Secondary | ICD-10-CM | POA: Insufficient documentation

## 2012-07-14 DIAGNOSIS — I2510 Atherosclerotic heart disease of native coronary artery without angina pectoris: Secondary | ICD-10-CM | POA: Insufficient documentation

## 2012-07-14 DIAGNOSIS — R011 Cardiac murmur, unspecified: Secondary | ICD-10-CM | POA: Insufficient documentation

## 2012-07-14 DIAGNOSIS — Z8639 Personal history of other endocrine, nutritional and metabolic disease: Secondary | ICD-10-CM | POA: Insufficient documentation

## 2012-07-14 NOTE — ED Notes (Signed)
Pt st's left foot has been swelling for about a month and is gradually getting worse.  Pt c/o pain in left foot and st's at times it turns blue and cool to touch.

## 2012-07-14 NOTE — ED Notes (Signed)
Discharge inst given to patient and daughter both voiced understanding

## 2012-07-14 NOTE — ED Provider Notes (Signed)
History     CSN: [id number]  Arrival date & time 07/14/12  1453   First MD Initiated Contact with Patient 07/14/12 1841      Chief Complaint  Patient presents with  . Foot Pain   HPI  79 y/o female with history of CAD, DM, HTN, Aortic Stenosis who presents with cc of bilateral feet swelling and pain. The patient states she has been seeing her primary care physician regarding the swelling and pain. They are currently treating her with diuretics as well as antibiotics. She was seen by her pcp earlier today for a check up. Per the patient and her daughter there was concerns the patient had "a circulation issue". She was sent here for further evaluation. The patient states that while she was at her appointment that her foot was blue.   Past Medical History  Diagnosis Date  . CAD (coronary artery disease)     5-vessel CABG in 1996: LIMA-LAD; SVG-OM- 2/OM-3; SVG-RCA  . Diabetes mellitus     insulin requiring -initial diagnosis approx 16 years ago -assoc peripheral neuropathy  . HTN (hypertension)   . Hypercholesterolemia     hx. intolerant to pravachol/lipitor secondary to myalgia  . Obesity   . Aortic stenosis   . Nephropathy     contrast    Past Surgical History  Procedure Laterality Date  . Coronary artery bypass graft    . Cholecystectomy    . Total abdominal hysterectomy    . Right shoulder surgery  Jan 2006    Family History  Problem Relation Age of Onset  . Hypertension Mother   . Diabetes Brother   . Throat cancer Other   . Kidney cancer Other     History  Substance Use Topics  . Smoking status: Never Smoker   . Smokeless tobacco: Not on file  . Alcohol Use: No    OB History   Grav Para Term Preterm Abortions TAB SAB Ect Mult Living                  Review of Systems  Constitutional: Negative for fever and chills.  HENT: Negative for congestion and rhinorrhea.   Respiratory: Negative for cough.   Cardiovascular: Positive for leg swelling.   Gastrointestinal: Negative for nausea and vomiting.  Musculoskeletal:       Left foot swelling and pain    Allergies  Ivp dye; Codeine; Penicillins; and Sulfonamide derivatives  Home Medications   Current Outpatient Rx  Name  Route  Sig  Dispense  Refill  . acetaminophen (TYLENOL) 325 MG tablet   Oral   Take 650 mg by mouth every 4 (four) hours as needed. For pain         . allopurinol (ZYLOPRIM) 100 MG tablet   Oral   Take 200 mg by mouth every morning.         Marland Kitchen aspirin EC 325 MG tablet   Oral   Take 325 mg by mouth daily.         . cephALEXin (KEFLEX) 500 MG capsule   Oral   Take 500 mg by mouth 4 (four) times daily.         . clonazePAM (KLONOPIN) 0.5 MG tablet   Oral   Take 0.5 mg by mouth 2 (two) times daily.         Marland Kitchen Coal Tar Extract 2.8 % SHAM   Apply externally   Apply 1 application topically daily as needed. Apply to scalp as needed  for leakage         . escitalopram (LEXAPRO) 5 MG tablet   Oral   Take 5 mg by mouth every morning.         . furosemide (LASIX) 20 MG tablet   Oral   Take 20 mg by mouth 2 (two) times daily.         . hydrOXYzine (ATARAX/VISTARIL) 25 MG tablet   Oral   Take 25 mg by mouth every 6 (six) hours as needed for itching. For itching         . insulin aspart (NOVOLOG FLEXPEN) 100 UNIT/ML injection   Subcutaneous   Inject 8 Units into the skin 3 (three) times daily before meals.          . insulin glargine (LANTUS SOLOSTAR) 100 UNIT/ML injection   Subcutaneous   Inject 40 Units into the skin at bedtime.         . isosorbide mononitrate (IMDUR) 60 MG 24 hr tablet   Oral   Take 60 mg by mouth every morning.         . lactulose (CHRONULAC) 10 GM/15ML solution   Oral   Take 20 g by mouth at bedtime.         Marland Kitchen levothyroxine (SYNTHROID) 75 MCG tablet   Oral   Take 75 mcg by mouth daily.          . metoprolol succinate (TOPROL-XL) 25 MG 24 hr tablet   Oral   Take 25 mg by mouth 2 (two) times  daily.          . Multiple Vitamin (MULTIVITAMIN WITH MINERALS) TABS   Oral   Take 1 tablet by mouth daily.         . nitroGLYCERIN (NITROSTAT) 0.4 MG SL tablet   Sublingual   Place 0.4 mg under the tongue every 5 (five) minutes as needed for chest pain. x3 doses as needed for chest pain         . omeprazole (PRILOSEC) 20 MG capsule   Oral   Take 20 mg by mouth daily.         . pregabalin (LYRICA) 100 MG capsule   Oral   Take 100 mg by mouth 3 (three) times daily.         Marland Kitchen pyridOXINE (VITAMIN B-6) 100 MG tablet   Oral   Take 100 mg by mouth daily.           Marland Kitchen senna-docusate (SENOKOT-S) 8.6-50 MG per tablet   Oral   Take 1 tablet by mouth 2 (two) times daily.         . vitamin B-12 (CYANOCOBALAMIN) 100 MCG tablet   Oral   Take 100 mcg by mouth daily.            BP 144/50  Pulse 76  Temp(Src) 98 F (36.7 C) (Oral)  Resp 16  SpO2 93%  Physical Exam  Nursing note and vitals reviewed. Constitutional: She is oriented to person, place, and time. She appears well-developed and well-nourished. No distress.  HENT:  Head: Normocephalic and atraumatic.  Eyes: Conjunctivae are normal. Pupils are equal, round, and reactive to light.  Neck: Normal range of motion. Neck supple.  Cardiovascular: Normal rate and regular rhythm.   Murmur heard.  Systolic murmur is present with a grade of 2/6  Pulses:      Dorsalis pedis pulses are 2+ on the right side, and 2+ on the left side.  Posterior tibial pulses are 2+ on the right side, and 2+ on the left side.  RUSB  Pulmonary/Chest: Effort normal and breath sounds normal.  Abdominal: Bowel sounds are normal. She exhibits no distension. There is no tenderness.  Musculoskeletal: Normal range of motion.  Neurological: She is alert and oriented to person, place, and time. A sensory deficit (decreased sensation in lower extremity bilaterally in stocking distribution) is present.  Skin: Skin is warm and dry.   Superficial diabetic foot wound with mild surrounding erythema on the heel of the left foot with surrounding flaking skin. Both feet are warm to the touch.   Psychiatric: She has a normal mood and affect.    ED Course  Procedures (including critical care time)  Labs Reviewed - No data to display Dg Foot Complete Left  07/14/2012  *RADIOLOGY REPORT*  Clinical Data: Redness and swelling in the left foot.  History of diabetes.  RIGHT FOOT COMPLETE - 3+ VIEW  Comparison: Ankle 09/15/2009  Findings: Three views of the left foot were obtained.  There is a spur along the plantar aspect of the calcaneus.  Alignment of the left foot is within normal limits.  There is no evidence for acute fracture or dislocation.  No evidence for cortical destruction or osteolysis.  Degenerative changes at the first MTP joint.  IMPRESSION: No acute bony abnormality in the left foot.   Original Report Authenticated By: Richarda Overlie, M.D.    1. Diabetic foot ulcer    MDM   79 y/o female with history of CAD, DM, HTN, Aortic Stenosis who presents with cc of bilateral feet swelling and pain. Afebrile. Well appearing. Superficial diabetic foot ulcer on left heel with no active drainage. Small ulcer on lateral aspect of left little toe. Mild erythema surrounding lesion on left heel. No significant cellulitis. Edema of both legs is symmetric so doubt DVT. An XR revealed no bony erosion of the left heel. Doubt osteo. No evidence of acute large vascular insufficiency. Likely chronic microvascular disease of diabetes causing poor wound healing. The patient was discharged home with instructions to continue taking her antibiotics as prescribed. Discussed with the patient that she wouldn't require emergent vascular studies but would likely benefit from non-emergent vascular surgery consultations as an outpatient. The patient states she will follow up with her pcp for referral for both vascular surgery and wound care.         Shanon Ace, MD 07/15/12 [phone]

## 2012-07-18 NOTE — ED Provider Notes (Signed)
I saw and evaluated the patient, reviewed the resident's note and I agree with the findings and plan. patient with an ulcer on her left heel. Sent in to rule out arterial occlusion. She has strong pulses. She has good capillary refill. This is likely microvascular disease. She will need vascular followup, but does not appear to need it emergently. She is already on antibiotics. She will be discharged home. Also doubt DVT.  [name]. Rubin Payor, MD 07/18/12 1451

## 2012-10-07 ENCOUNTER — Other Ambulatory Visit: Payer: Self-pay | Admitting: Cardiology

## 2013-06-25 DEATH — deceased

## 2013-07-24 ENCOUNTER — Telehealth: Payer: Self-pay

## 2013-07-24 NOTE — Telephone Encounter (Signed)
Patient past away per [facility] [place] °
